# Patient Record
Sex: Female | Born: 1935 | Race: White | Hispanic: No | Smoking: Never smoker
Health system: Southern US, Community
[De-identification: ages and names within clinical notes are randomized; demographics above are authoritative.]

## PROBLEM LIST (undated history)

## (undated) DIAGNOSIS — R32 Unspecified urinary incontinence: Secondary | ICD-10-CM

## (undated) DIAGNOSIS — M1990 Unspecified osteoarthritis, unspecified site: Secondary | ICD-10-CM

## (undated) DIAGNOSIS — M810 Age-related osteoporosis without current pathological fracture: Secondary | ICD-10-CM

## (undated) DIAGNOSIS — H409 Unspecified glaucoma: Secondary | ICD-10-CM

## (undated) DIAGNOSIS — M778 Other enthesopathies, not elsewhere classified: Secondary | ICD-10-CM

## (undated) DIAGNOSIS — F32A Depression, unspecified: Secondary | ICD-10-CM

## (undated) DIAGNOSIS — R600 Localized edema: Secondary | ICD-10-CM

## (undated) DIAGNOSIS — R29898 Other symptoms and signs involving the musculoskeletal system: Secondary | ICD-10-CM

## (undated) DIAGNOSIS — G5603 Carpal tunnel syndrome, bilateral upper limbs: Secondary | ICD-10-CM

## (undated) DIAGNOSIS — M7582 Other shoulder lesions, left shoulder: Secondary | ICD-10-CM

## (undated) DIAGNOSIS — S92901A Unspecified fracture of right foot, initial encounter for closed fracture: Secondary | ICD-10-CM

## (undated) DIAGNOSIS — I639 Cerebral infarction, unspecified: Secondary | ICD-10-CM

## (undated) DIAGNOSIS — F329 Major depressive disorder, single episode, unspecified: Secondary | ICD-10-CM

## (undated) DIAGNOSIS — H5462 Unqualified visual loss, left eye, normal vision right eye: Secondary | ICD-10-CM

## (undated) HISTORY — DX: Unspecified glaucoma: H40.9

## (undated) HISTORY — DX: Age-related osteoporosis without current pathological fracture: M81.0

## (undated) HISTORY — DX: Carpal tunnel syndrome, bilateral upper limbs: G56.03

## (undated) HISTORY — DX: Major depressive disorder, single episode, unspecified: F32.9

## (undated) HISTORY — DX: Unspecified fracture of right foot, initial encounter for closed fracture: S92.901A

## (undated) HISTORY — DX: Unspecified osteoarthritis, unspecified site: M19.90

## (undated) HISTORY — DX: Depression, unspecified: F32.A

## (undated) HISTORY — DX: Cerebral infarction, unspecified: I63.9

## (undated) HISTORY — DX: Other symptoms and signs involving the musculoskeletal system: R29.898

## (undated) HISTORY — DX: Other enthesopathies, not elsewhere classified: M77.8

## (undated) HISTORY — DX: Unqualified visual loss, left eye, normal vision right eye: H54.62

## (undated) HISTORY — DX: Localized edema: R60.0

## (undated) HISTORY — DX: Unspecified urinary incontinence: R32

## (undated) HISTORY — PX: CHOLECYSTECTOMY: SHX55

## (undated) HISTORY — DX: Other shoulder lesions, left shoulder: M75.82

---

## 1998-10-10 HISTORY — PX: CATARACT EXTRACTION, BILATERAL: SHX1313

## 2001-10-10 HISTORY — PX: CARPAL TUNNEL RELEASE: SHX101

## 2004-09-16 ENCOUNTER — Ambulatory Visit: Payer: Self-pay | Admitting: Internal Medicine

## 2005-04-05 ENCOUNTER — Ambulatory Visit: Payer: Self-pay | Admitting: Ophthalmology

## 2005-06-29 ENCOUNTER — Ambulatory Visit: Payer: Self-pay | Admitting: Unknown Physician Specialty

## 2005-07-06 ENCOUNTER — Ambulatory Visit: Payer: Self-pay | Admitting: Unknown Physician Specialty

## 2005-07-10 HISTORY — PX: JOINT REPLACEMENT: SHX530

## 2005-08-01 ENCOUNTER — Other Ambulatory Visit: Payer: Self-pay

## 2005-08-09 ENCOUNTER — Inpatient Hospital Stay: Payer: Self-pay | Admitting: Unknown Physician Specialty

## 2005-08-11 ENCOUNTER — Encounter: Payer: Self-pay | Admitting: Internal Medicine

## 2005-08-31 ENCOUNTER — Ambulatory Visit: Payer: Self-pay | Admitting: Unknown Physician Specialty

## 2006-05-31 ENCOUNTER — Ambulatory Visit: Payer: Self-pay | Admitting: Internal Medicine

## 2007-09-21 ENCOUNTER — Ambulatory Visit: Payer: Self-pay | Admitting: Specialist

## 2008-11-19 ENCOUNTER — Ambulatory Visit: Payer: Self-pay | Admitting: Otolaryngology

## 2010-03-10 ENCOUNTER — Ambulatory Visit: Payer: Self-pay | Admitting: Oncology

## 2010-03-24 ENCOUNTER — Ambulatory Visit: Payer: Self-pay | Admitting: Unknown Physician Specialty

## 2010-03-26 ENCOUNTER — Ambulatory Visit: Payer: Self-pay | Admitting: Unknown Physician Specialty

## 2010-04-02 ENCOUNTER — Ambulatory Visit: Payer: Self-pay | Admitting: Oncology

## 2010-04-07 ENCOUNTER — Ambulatory Visit: Payer: Self-pay | Admitting: Oncology

## 2010-04-09 ENCOUNTER — Ambulatory Visit: Payer: Self-pay | Admitting: Oncology

## 2010-08-04 ENCOUNTER — Emergency Department (HOSPITAL_COMMUNITY): Admission: EM | Admit: 2010-08-04 | Discharge: 2010-08-04 | Payer: Self-pay | Admitting: Emergency Medicine

## 2011-02-13 ENCOUNTER — Emergency Department (HOSPITAL_COMMUNITY)
Admission: EM | Admit: 2011-02-13 | Discharge: 2011-02-13 | Disposition: A | Discharge status: Home / Self Care | Payer: Medicare Other | Attending: Emergency Medicine | Admitting: Emergency Medicine

## 2011-02-13 DIAGNOSIS — Z8673 Personal history of transient ischemic attack (TIA), and cerebral infarction without residual deficits: Secondary | ICD-10-CM | POA: Insufficient documentation

## 2011-02-13 DIAGNOSIS — S30860A Insect bite (nonvenomous) of lower back and pelvis, initial encounter: Secondary | ICD-10-CM | POA: Insufficient documentation

## 2011-02-13 DIAGNOSIS — H409 Unspecified glaucoma: Secondary | ICD-10-CM | POA: Insufficient documentation

## 2011-02-13 DIAGNOSIS — W57XXXA Bitten or stung by nonvenomous insect and other nonvenomous arthropods, initial encounter: Secondary | ICD-10-CM | POA: Insufficient documentation

## 2011-07-05 ENCOUNTER — Ambulatory Visit: Payer: Self-pay | Admitting: Otolaryngology

## 2013-09-25 ENCOUNTER — Inpatient Hospital Stay: Payer: Self-pay | Admitting: Internal Medicine

## 2013-09-25 LAB — TSH: Thyroid Stimulating Horm: 0.975 u[IU]/mL

## 2013-09-25 LAB — URINALYSIS, COMPLETE
Bacteria: NONE SEEN
Bilirubin,UR: NEGATIVE
Blood: NEGATIVE
Glucose,UR: NEGATIVE mg/dL (ref 0–75)
Ketone: NEGATIVE
Nitrite: NEGATIVE
Protein: NEGATIVE
Specific Gravity: 1.008 (ref 1.003–1.030)
Squamous Epithelial: 1

## 2013-09-25 LAB — TROPONIN I: Troponin-I: 0.05 ng/mL

## 2013-09-26 ENCOUNTER — Ambulatory Visit: Payer: Self-pay | Admitting: Internal Medicine

## 2013-09-26 LAB — CBC WITH DIFFERENTIAL/PLATELET
Eosinophil #: 0.1 10*3/uL (ref 0.0–0.7)
Eosinophil %: 1.2 %
HCT: 38 % (ref 35.0–47.0)
Lymphocyte #: 2.5 10*3/uL (ref 1.0–3.6)
Lymphocyte %: 28.7 %
MCH: 29.6 pg (ref 26.0–34.0)
Monocyte %: 8.9 %
RDW: 12.1 % (ref 11.5–14.5)

## 2013-09-26 LAB — TROPONIN I: Troponin-I: 0.07 ng/mL — ABNORMAL HIGH

## 2013-09-26 LAB — BASIC METABOLIC PANEL
Anion Gap: 5 — ABNORMAL LOW (ref 7–16)
BUN: 15 mg/dL (ref 7–18)
Co2: 29 mmol/L (ref 21–32)

## 2013-09-27 LAB — TROPONIN I: Troponin-I: 0.07 ng/mL — ABNORMAL HIGH

## 2013-09-28 LAB — CBC WITH DIFFERENTIAL/PLATELET
Basophil #: 0.1 10*3/uL (ref 0.0–0.1)
Basophil %: 0.4 %
Eosinophil #: 0 10*3/uL (ref 0.0–0.7)
HCT: 39.3 % (ref 35.0–47.0)
HGB: 12.7 g/dL (ref 12.0–16.0)
Lymphocyte %: 16.3 %
MCHC: 32.3 g/dL (ref 32.0–36.0)
Neutrophil %: 74.5 %

## 2013-09-28 LAB — BASIC METABOLIC PANEL
Co2: 27 mmol/L (ref 21–32)
EGFR (African American): >60
EGFR (Non-African Amer.): >60
Glucose: 139 mg/dL — ABNORMAL HIGH (ref 65–99)
Osmolality: 275 (ref 275–301)
Potassium: 3.7 mmol/L (ref 3.5–5.1)
Sodium: 136 mmol/L (ref 136–145)

## 2013-09-29 LAB — CBC WITH DIFFERENTIAL/PLATELET
Basophil #: 0 10*3/uL (ref 0.0–0.1)
Basophil %: 0.3 %
Eosinophil %: 0.1 %
HCT: 39.4 % (ref 35.0–47.0)
HGB: 12.9 g/dL (ref 12.0–16.0)
Monocyte #: 1.3 x10 3/mm — ABNORMAL HIGH (ref 0.2–0.9)
RBC: 4.4 10*6/uL (ref 3.80–5.20)

## 2013-09-29 LAB — BASIC METABOLIC PANEL
Anion Gap: 8 (ref 7–16)
Calcium, Total: 8.8 mg/dL (ref 8.5–10.1)
Co2: 27 mmol/L (ref 21–32)
EGFR (African American): >60
EGFR (Non-African Amer.): >60
Glucose: 134 mg/dL — ABNORMAL HIGH (ref 65–99)
Osmolality: 283 (ref 275–301)
Potassium: 3.7 mmol/L (ref 3.5–5.1)
Sodium: 139 mmol/L (ref 136–145)

## 2013-09-29 LAB — URINALYSIS, COMPLETE
Glucose,UR: NEGATIVE mg/dL (ref 0–75)
Nitrite: NEGATIVE
Specific Gravity: 1.023 (ref 1.003–1.030)
WBC UR: 26 /HPF (ref 0–5)

## 2013-09-30 ENCOUNTER — Encounter: Payer: Self-pay | Admitting: Internal Medicine

## 2013-09-30 LAB — BASIC METABOLIC PANEL
Anion Gap: 4 — ABNORMAL LOW (ref 7–16)
BUN: 21 mg/dL — ABNORMAL HIGH (ref 7–18)
Calcium, Total: 8.6 mg/dL (ref 8.5–10.1)
Chloride: 104 mmol/L (ref 98–107)
EGFR (Non-African Amer.): >60
Osmolality: 278 (ref 275–301)
Potassium: 3.5 mmol/L (ref 3.5–5.1)
Sodium: 137 mmol/L (ref 136–145)

## 2013-09-30 LAB — CBC WITH DIFFERENTIAL/PLATELET
Basophil #: 0 10*3/uL (ref 0.0–0.1)
Eosinophil %: 0.3 %
Lymphocyte #: 2.5 10*3/uL (ref 1.0–3.6)
Lymphocyte %: 21.7 %
MCHC: 31.8 g/dL — ABNORMAL LOW (ref 32.0–36.0)
Monocyte %: 10.2 %
Neutrophil #: 7.8 10*3/uL — ABNORMAL HIGH (ref 1.4–6.5)
RDW: 12.3 % (ref 11.5–14.5)

## 2013-10-01 ENCOUNTER — Ambulatory Visit: Payer: Self-pay | Admitting: Gerontology

## 2013-10-10 ENCOUNTER — Encounter: Payer: Self-pay | Admitting: Internal Medicine

## 2013-10-10 ENCOUNTER — Ambulatory Visit: Payer: Self-pay | Admitting: Internal Medicine

## 2013-10-18 LAB — RAPID INFLUENZA A&B ANTIGENS

## 2015-01-30 NOTE — H&P (Signed)
PATIENT NAME:  Kristin Graham, Kristin Graham MR#:  [id number] DATE OF BIRTH:  22-Jan-1916  DATE OF ADMISSION:  09/25/2013  CHIEF COMPLAINT: Back pain, speech difficulties.   HISTORY OF PRESENT ILLNESS: The patient is a 81 year old female with history of osteoarthritis and degenerative disk disease, history of left eye vision loss, who presents from home after daughter called in about worsening incontinence. Story is slightly challenging to follow but it appears that she had a fall about a week ago with some back discomfort but was able to walk after this, did not show any signs or sequelae severe injury. She had episodes then a few days later where she seemed to be having trouble speaking although she was not confused. They report that she has not been walking as well, seeming to not move the left leg as well, although it is difficult to tell how long this has gone on. Her speech had improved; however, she had another fall about a day or 2 ago and after this time speech became worse. She developed worsening back pain approximately 24 hours after the fall. She has been completely incontinent of urine although continent of bowel, though has a long history of bladder incontinence with previous urology evaluation. She reports that the left eye vision may be worse as well. On evaluation, she is having spasms of pain in her left mid back. She is having trouble lifting her left leg though does not appear to have point tenderness over the hip. She has dysarthria which is new and appears to have some left facial droop. She has been on aspirin 325 already. She is admitted now with what appears to be new stroke symptoms, recurrent falls with back spasm related to this, worsening incontinence and possibly worsening left eye vision loss.   PAST MEDICAL HISTORY: 1.  Depression and anxiety.  2.  History of glaucoma.  3.  Osteoarthritis.  4.  History of prior CVA.  5.  Chronic urinary incontinence with prior urology evaluation with  failure of pessary and multiple medications.  6.  Osteoporosis with history of right foot fracture.  7.  Left eye vision loss with prior evaluation through Dr. Fransico Michael thought to be secondary to glaucoma versus ischemic neuropathy.  8.  Chronic leg edema consistent with venous insufficiency.  9.  Bilateral carpal tunnel syndrome  with prior surgery.  10.  Degenerative disk disease. 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 She was discussed at tumor conference and seen by neurosurgery, who agreed.  11.  Status post bilateral cataract repairs.  12.  Status post cholecystectomy.  13.  History of left knee replacement.   ALLERGIES: ANTI-INFLAMMATORIES CAUSE EDEMA. VICODIN CAUSES NAUSEA AND VOMITING.   MEDICATIONS: 1.  Aspirin 325 mg p.o. daily.  2.  Vitamin B50 one tablet p.o. daily.  3.  MSM 500 mg p.o. b.i.d.  4.  Multivitamin 1 p.o. daily.  5.  Vitamin C 1000 mg p.o. t.i.d.  6.  Vitamin E 400 units daily.  7.   Zinc 40 mg p.o. daily.  8.  Vitamin B12 100 mcg p.o. daily.  9.  Tylenol as needed.  10.  Cosopt 1 drop into both eyes twice a day.  11.  Calcium 600 mg p.o. b.i.d.  12.  Omega-3 750 mg p.o. daily.  13.  Potassium 99 one p.o. daily.  14.  Lasix 20  mg p.o. daily.   SOCIAL HISTORY: No tobacco or alcohol.   FAMILY HISTORY: Cancer, stroke, osteoporosis.   REVIEW OF SYSTEMS: Please see HPI.  No recent fevers, chills. No bowel incontinence. No headache currently. Remainder of complete review of systems is negative.   PHYSICAL EXAMINATION: VITAL SIGNS: Blood pressure 130/71, temperature 98.8, pulse 73.  GENERAL: Elderly female, wincing periodically from discomfort in the back.  EYES: Postoperative changes bilaterally with decreased visual acuity in the left eye.  EARS, NOSE AND THROAT: Left side of the mouth is held open with mild  dysarthria which is new.  NECK: Trachea midline. No thyromegaly.  HEART: Regular rate and rhythm without gallops or rubs. Carotid and radial pulses 2+.  LUNGS: Clear bilaterally without wheeze or retraction. Saturation 94% on room air.  ABDOMEN: Soft, nontender, positive bowel sounds.  LYMPH NODES: No cervical or supraclavicular nodes.  MUSCULOSKELETAL: No clubbing or cyanosis. Trace ankle edema.  NEUROLOGIC: Decreased visual acuity in the left eye. Left facial droop as noted above. Upper extremity strength appears to be symmetrical though a little bit limited exam secondary to back pain. Ambulates with keeping the left knee and hip flexed, then taking weight very rapidly off the left leg when trying to walk.   IMPRESSION AND PLAN: 1.  Left facial droop. Worrisome for stroke symptoms, particularly with stuttering nature. We will get stat CT to exclude bleed. Consider adding Plavix but need to see what the CT scan looks like first. Baby aspirin for now. Check echo, carotid Dopplers and will get neurology to see the patient.  2.  Back pain. We will get a chest x-ray to take a look at this and should be able to see the back with this as well. Will get L-spine films as well given the urinary incontinence, though it is unclear whether this is really a change or whether she is hurting enough that she is simply not getting to go urinate. Would ask neurology help with this also.  3.  Glaucoma. Would continue her on her home eyedrops. Consider ophthalmology consultation, would like to see what neurology has to say with the above.   4.  Deep vein thrombosis prophylaxis. Will use thromboembolic disease hose for now until bleed is excluded.    ____________________________ Lynnea FerrierBert J. Klein III, MD bjk:cs D: 09/25/2013 17:43:13 ET T: 09/25/2013 18:20:38 ET JOB#: [id number]  cc: Lynnea FerrierBert J. Klein III, MD, <Dictator> Daniel NonesBERT KLEIN MD ELECTRONICALLY SIGNED 10/01/2013 12:25

## 2015-01-30 NOTE — Discharge Summary (Signed)
PATIENT NAME:  Kristin Graham, Kristin Graham MR#:  [id number] DATE OF BIRTH:  04-24-1916  DATE OF ADMISSION:  09/25/2013 DATE OF DISCHARGE:  09/30/2013  FINAL DIAGNOSES:  1.  Left pontine cerebrovascular accident, acute.  2.  Fall with pulmonary contusion and back spasms.  3.  Chronic left eye vision loss.  4.  Glaucoma.  5.  Osteoarthritis with severe degenerative disk disease.  6.  Left leg numbness, weakness and neuropathy secondary to degenerative disk disease.  7.  Chronic leg edema with venous insufficiency and mild lymphedema.  8.  History of carpal tunnel syndrome with prior surgery.  9.  History of bilateral cataract repairs.  10. History of cholecystectomy.  11. History of left knee replacement.  12. Depression and anxiety.  13. Osteoporosis.  14. Chronic urinary incontinence.   HISTORY AND PHYSICAL: Please see dictated admission history and physical.    HOSPITAL COURSE: The patient was admitted after having a fall and suffering significant left back pain. Films were performed of the chest, lower spine and left hip, with significant arthritis, but no fracture evident. Small left pleural effusion was seen and pulmonary saw her. It was thought most likely to represent a small hemothorax secondary to pulmonary contusion from a fall and did not change on subsequent followup films. Her oxygen saturation remained stable. She underwent CT, which showed no acute changes. Subsequent MRI revealed a left pontine cerebrovascular accident, which appeared to be the source of her dysarthria. Physical therapy and speech therapy saw the patient. She passed her swallowing screen and her speech did improve. Carotid Dopplers were negative. Echocardiogram revealed preserved LV function and no source of embolism. Neurology saw the patient and they recommended anticoagulation with aspirin only given her advanced age and multiple falls.   Physical therapy continued work with the patient and she showed significant  difficulty with this. She normally ambulates flexed at the hip secondary to significant degenerative disk disease; however, this was clearly worse. She also was having some back spasms, which interfered with mobility. It became clear that she would require more help than she would be able to obtain at home, although her pain did seem to improve.   Ophthalmology was kind enough to see the patient in the hospital as well with concerns about left vision changes. Her pressures were stable. Dilated did not appear to reveal any acute changes. The family members are aware that the exam is somewhat limited by the inability to use a slit lamp and more sophisticated equipment here in the hospital as these are not available outside the ophthalmologist's office. The plan is for her to return her ophthalmologist's office in the near future and family members are comfortable with this. We discussed placement secondary to poor mobility. Palliative care saw the patient as well. The patient and family members are willing to pursue placement, and so the patient will be discharged to skilled nursing facility in stable condition with physical activity up with a walker with assistance as tolerated. Her diet will be no added salt, mechanical soft, with the patient to be monitored for signs or symptoms of aspiration. PT, speech therapy and OT to see the patient. She will follow up with the nursing home physician. We will also plan to have her follow up with Dr. Fransico Michael in the near future to evaluate her eye.   DISCHARGE MEDICATIONS:  1.  Atorvastatin 10 mg p.o. at bedtime, new medication. 2.  Aspirin 325 mg p.o. daily.  3.  Skelaxin 800 mg half  tablet p.o. t.i.d. as needed for back pain.  4.  Colace 100 mg p.o. b.i.d., hold for loose stools.  5.  Furosemide 20 mg p.o. daily as needed for edema.  6.  Lumigan 1 drop to the left eye once a day.  7.  Cosopt 1 drop to each eye twice a day.  8.  Calcium plus D 600 mg p.o. daily.   9.  Vitamin B12 1000 mcg p.o. daily.  10. Omega-3 600 mg p.o. daily.  11. Ultracet 1 p.o. 4 times a day as needed for pain.  12. Acetaminophen 325 mg p.o. t.i.d. while awake, to be given in a standing dose for pain relief.   CODE STATUS: The patient was full code during his hospitalization.  ____________________________ Lynnea FerrierBert J. Klein III, MD bjk:aw D: 09/30/2013 13:13:16 ET T: 09/30/2013 13:25:40 ET JOB#: [id number]  cc: Lynnea FerrierBert J. Klein III, MD, <Dictator> Daniel NonesBERT KLEIN MD ELECTRONICALLY SIGNED 10/01/2013 12:25

## 2015-01-30 NOTE — Consult Note (Signed)
Referring Physician:  Tama High   Primary Care Physician:  [facility]ama [facility], [address] [place], [place] [address], [phone]  Reason for Consult: Admit Date: 25-Sep-2013  Chief Complaint: Back pain, speech difficulties  Reason for Consult: CVA; back pain, speech difficulties   History of Present Illness: History of Present Illness:   81 year old woman with a complex medical history presents with decreased ambulation, decreased use of left leg and slurred speech.  Seems that symptoms started a few days ago and have waxed and waned.  Also some new left eye visual disturbance.  Initially seen to have mild facial droop.  At this point, most of these symptoms have resolved.  Still some very mild LLE weakness, no definite slurred speech.  She was worked up and found on MRI to have an acute to subacute left pontine infarction.  Patient feels her symptoms are improving over the past 24 hours.  Feels in particular her speech is much better.  Symptoms are constant.  Mild severity.  No clear provoking factors.   MEDICAL AND SURGICAL HISTORIES: Depression and anxiety.  History of glaucoma.  Osteoarthritis.  History of prior CVA.  Chronic urinary incontinence with prior urology evaluation with failure of pessary and multiple medications.  Osteoporosis with history of right foot fracture.  Left eye vision loss with prior evaluation through Dr. Tobe Sos thought to be secondary to glaucoma versus ischemic neuropathy.  Chronic leg edema consistent with venous insufficiency.  Bilateral carpal tunnel syndrome  with prior surgery.  Degenerative disk disease. 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 She was discussed at tumor conference and seen by neurosurgery, who agreed.  Status post bilateral cataract repairs.  Status post cholecystectomy.   History of left knee replacement.  HISTORY: tobacco or alcohol use. HISTORY:   Aspirin 325 mg p.o. daily.  Vitamin B50 one tablet p.o. daily.  MSM 500 mg p.o. b.i.d.  Multivitamin 1 p.o. daily.  Vitamin C 1000 mg p.o. t.i.d.  Vitamin E 400 units daily.   Zinc 40 mg p.o. daily.  Vitamin B12 100 mcg p.o. daily.  Tylenol as needed.  Cosopt 1 drop into both eyes twice a day.  Calcium 600 mg p.o. b.i.d.  Omega-3 750 mg p.o. daily.  Potassium 99 one p.o. daily.  Lasix 20 mg p.o. daily.  CAUSE EDEMA. CAUSES NAUSEA AND VOMITING.   ROS:  General denies complaints   HEENT no complaints   Lungs no complaints   Cardiac no complaints   GI no complaints   GU no complaints   Musculoskeletal no complaints   Extremities no complaints   Skin no complaints   Endocrine no complaints   Psych no complaints   Past Medical/Surgical Hx:  stroke:   shingles:   osteoporosis:   gout:   glaucoma:   depression:   chicken pox:   arthritis:   removal of distal locking screws:   gallbladder removed:   carpal tunnel release:   left total knee replacement:   Home Medications: Medication Instructions Last Modified Date/Time  Cosopt ophthalmic solution both eyes 17-Dec-14 19:45  tylenol 325-659m every 3-4 hours as needed  17-Dec-14 19:45  tramadol37.5/acetaminophen 1-2 po every 4-6 hours PRN  17-Dec-14 19:45  Calcium 600+D   2 times a day  17-Dec-14 19:45  vitamin c 1000 mg  2 times a day  17-Dec-14 19:45  vitamin e 400   once a day  17-Dec-14 19:45  vitamin b 50    once a day  17-Dec-14 19:45  b12 1000   once a day  17-Dec-14 19:45  multi  vitamin senior    once a day  17-Dec-14 19:45  Zinc 50 mg   once a day  17-Dec-14 19:45  msg 1000   2 times a day  17-Dec-14 19:45  genko 60 mg    once a day  17-Dec-14 19:45  magnesium 250   once a day (at bedtime)  17-Dec-14 19:45  asa 325 mg    once a day  17-Dec-14 19:45  Lasix 0.5  of a 10 mg tablet orally  17-Dec-14 19:45  omega 3 600 mg    2  times a day 17-Dec-14 19:45   [facility] Current Meds:   Sodium Chloride 0.9%, 1000 ml at 50 ml/hr  Acetaminophen * tablet, ( Tylenol (325 mg) tablet)  650 mg Oral q4h PRN for pain or temp. greater than 100.4  - Indication: Pain/Fever  Aspirin Enteric Coated tablet, ( Ecotrin)  81 mg Oral daily  - Indication: Pain/Fever/Thromboembolic Disorders/Post MI/Prophylaxis MI  Instructions:  Initiate Bleeding Precautions Protocol--DO NOT CRUSH  Docusate Sodium capsule, ( Colace)  100 mg Oral bid PRN for constipation  - Indication: Stool Softener  Instructions:  hold for loose stool  Dorzolamide-Timolol Ophth drops,  ( Cosopt Ophth drops )  1 drop(s) Both Eyes bid  -Indication:Glaucoma  Ondansetron injection, ( Zofran injection )  4 mg, IV push, q4h PRN for Nausea/Vomiting  Indication: Nausea/ Vomiting  Pantoprazole tablet, 40 mg Oral daily  - Indication: Erosive Esophagitis/ GERD  Instructions:  DO NOT CRUSH  Sodium Chloride 0.9% injection, 3 ml, IV push, q6h  Nursing Saline Flush, 3 to 6 ml, IV push, Q1M PRN for IV Maintenance  Clopidogrel tablet,  ( pLAVix)  75 mg Oral daily  Instructions:  Initiate Bleeding Precautions Protocol  Metaxalone tablet,  ( Skelaxin)  400 mg Oral tid PRN for back pain  Bimatoprost 0.01% Ophth soln,  ( Lumigan 0.01% Ophth soln )  1 drop(s) Left Eye at bedtime  -Indication:Ocular Hypertension, Glaucoma  Allergies:  Vicodin: N/V  Bee Stings: Itching  Anti-inflammatories: Unknown  Vital Signs: **Vital Signs.:   18-Dec-14 08:47  Vital Signs Type Routine  Temperature Temperature (F) 98.4  Celsius 36.8  Temperature Source oral  Pulse Pulse 67  Respirations Respirations 20  Systolic BP Systolic BP 740  Diastolic BP (mmHg) Diastolic BP (mmHg) 74  Mean BP 93  Pulse Ox % Pulse Ox % 93  Pulse Ox Activity Level  At rest  Oxygen Delivery Room Air/ 21 %   EXAM: GENERAL: Pleasant elderly woman.  In NAD.  Normocephalic and atraumatic.  EYES: Funduscopic  exam shows normal disc size, appearance and C/D ratio without clear evidence of papilledema.  CARDIOVASCULAR: S1 and S2 sounds are within normal limits, without murmurs, gallops, or rubs.  MUSCULOSKELETAL: Bulk - Normal Tone - Normal Pronator Drift - Absent bilaterally. Ambulation - Deferred due to falls risk.   R/L 4/4    Shoulder abduction (deltoid/supraspinatus, axillary/suprascapular n, C5) 4/4    Elbow flexion (biceps brachii, musculoskeletal n, C5-6) 4/4    Elbow extension (triceps, radial n, C7) 4/4    Finger adduction (interossei, ulnar n, T1)   4/4-    Hip flexion (iliopsoas, L1/L2) 4/4    Knee flexion (hamstrings, sciatic n, L5/S1)  4/4    Knee extension (quadriceps, femoral n, L3/4) 4/4    Ankle dorsiflexion (tibialis anterior, deep fibular n, L4/5) 4/4    Ankle plantarflexion (gastroc, tibial n, S1)   NEUROLOGICAL: MENTAL STATUS: Patient is oriented to person, place and time.  Recent and remote memory are intact.  Attention span and concentration are intact.  Naming, repetition, comprehension and expressive speech are within normal limits.  Patient's fund of knowledge is within normal limits for educational level.  CRANIAL NERVES: Normal    CN II (normal visual acuity and visual fields) Normal    CN III, IV, VI (extraocular muscles are intact) Normal    CN V (facial sensation is intact bilaterally) Normal    CN VII (facial strength is intact bilaterally) Normal    CN VIII (hearing is intact bilaterally) Normal    CN IX/X (palate elevates midline, normal phonation) Normal    CN XI (shoulder shrug strength is normal and symmetric) Normal    CN XII (tongue protrudes midline)   SENSATION: Intact to pain and temp bilaterally (spinothalamic tracts) Intact to position and vibration bilaterally (dorsal columns)   REFLEXES: R/L 2+/2+    Biceps 2+/2+    Brachioradialis   2+/2+    Patellar 2+/2+    Achilles   COORDINATION/CEREBELLAR: Finger to nose testing is within  normal limits..  Lab Results:  Thyroid:  17-Dec-14 19:31   Thyroid Stimulating Hormone 0.975 (0.45-4.50 (International Unit)  ----------------------- Pregnant patients have  different reference  ranges for TSH:  - - - - - - - - - -  Pregnant, first trimetser:  0.36 - 2.50 uIU/mL)  LabObservation:  18-Dec-14 09:35   OBSERVATION Reason for Test  Routine Micro:  17-Dec-14 18:57   Micro Text Report URINE CULTURE   COMMENT                   NO GROWTH IN 8-12 HOURS   ANTIBIOTIC                       Specimen Source CLEAN CATCH  Culture Comment NO GROWTH IN 8-12 HOURS  Result(s) reported on 26 Sep 2013 at 12:56PM.  General Ref:  17-Dec-14 19:31   Vitamin B12, Serum ========== TEST NAME ==========  ========= RESULTS =========  = REFERENCE RANGE =  VITAMIN B12  Vitamin B12 Vitamin B12                     [facility]  >1999 pg/mL          ]           [id number]               [facility]            No: [id number]           [address], [place], [place] [address]           Lindon Romp, MD         [phone]   Result(s) reported on 26 Sep 2013 at 06:47AM.  Cardiology:  18-Dec-14 06:35   Ventricular Rate 72  Atrial Rate 72  P-R Interval 230  QRS Duration 84  QT 408  QTc 446  P Axis 33  R Axis -48  T Axis 18  ECG interpretation Sinus rhythm with 1st degree A-V block Left anterior fascicular block Minimal voltage criteria for LVH, may be normal variant Inferior infarct , age undetermined Anteroseptal  infarct , age undetermined Abnormal ECG When compared with ECG of 01-Aug-2005 14:16, PR interval has increased Anteroseptal infarct is now Present Inferior infarct is now Present ----------unconfirmed---------- Confirmed by OVERREAD, NOT ([id number]), editor PEARSON, BARBARA ([id number]) on 09/26/2013 10:20:51 AM    09:12   Echo Doppler REASON FOR EXAM:     COMMENTS:     PROCEDURE: [place] - ECHO DOPPLER COMPLETE(TRANSTHOR)  - Sep 26 2013  9:12AM   RESULT: Echocardiogram  Report  Patient Name:   ALITZEL COOKSON Date of Exam: 09/26/2013 Medical Rec #:  [id number]              Custom1: Date of Birth:  1916/02/04           Height:       68.1 in Patient Age:    89 years            Weight:       174.2 lb Patient Gender: F                   BSA:          1.93 m??  Indications: CVA Sonographer:    Sherrie Sport RDCS Referring Phys: [facility]  Summary:  1. Left ventricular ejection fraction, by visual estimation, is 70 to  75%.  2. Normal global left ventricular systolic function.  3. Moderately dilated left atrium.  4. Moderately dilated right atrium.  5. Moderate mitral valve regurgitation.  6. Moderate tricuspid regurgitation. 2D AND M-MODE MEASUREMENTS (normal ranges within parentheses): Left Ventricle:          Normal IVSd (2D):      1.17 cm (0.7-1.1) LVPWd (2D):     1.03 cm (0.7-1.1) Aorta/LA:       Normal LVIDd (2D):     4.01 cm (3.4-5.7) Aortic Root (2D): 3.40 cm (2.4-3.7) LVIDs (2D):     2.19 cm           Left Atrium (2D): 3.20 cm (1.9-4.0) LV FS (2D):     45.4 %   (>25%) LV EF (2D):     77.3 %   (>50%)       Right Ventricle:                                   RVd (2D):        1.01 cm LV DIASTOLIC FUNCTION: MV Peak E: 0.73 m/s E/e' Ratio: 19.60 MV Peak A: 1.04 m/s Decel Time: 268 msec E/A Ratio: 0.70 SPECTRAL DOPPLER ANALYSIS (where applicable): Mitral Valve: MV P1/2 Time: 77.72 msec MV Area, PHT: 2.83 cm?? Aortic Valve: AoV Max Vel: 1.12 m/s AoV Peak PG: 5.0 mmHg AoV Mean PG: LVOT Vmax: 1.13 m/s LVOT VTI:  LVOT Diameter: 2.20 cm AoV Area, Vmax: 3.83 cm?? AoV Area, VTI:  AoV Area, Vmn: Tricuspid Valve and PA/RV Systolic Pressure: TR Max Velocity: 2.79 m/s RA  Pressure: 5 mmHg RVSP/PASP: 36.1 mmHg Pulmonic Valve: PV Max Velocity: 0.74 m/s PV Max PG: 2.2 mmHg PV Mean PG:  PHYSICIAN INTERPRETATION: Left Ventricle: The left ventricular internal cavity size was normal. LV  posterior wall thickness was normal. Global LV systolic function was   normal. Left ventricular ejection fraction, by visual estimation, is 70  to 75%. Right Ventricle: Normal right ventricular size, wallthickness, and  systolic function. RV wall thickness is normal. Left Atrium: The left atrium is moderately dilated. Right Atrium: The right atrium  is moderately dilated. Pericardium: There is no evidence of pericardial effusion. Mitral Valve: Moderate mitral valve regurgitation is seen. Tricuspid Valve: Moderate tricuspid regurgitation is visualized. The  tricuspid regurgitant velocity is 2.79 m/s, and with an assumed right  atrial pressure of 5 mmHg, the estimated right ventricular systolic  pressure is normal at 36.1 mmHg. Aortic Valve: The aortic valve is trileaflet and structurally normal,  with normal leaflet excursion; without any evidence of aortic stenosis or  insufficiency. Pulmonic Valve: Structurally normal pulmonic valve, with normal leaflet  excursion. Aorta: The aortic root and ascending aorta are structurally normal, with  no evidence of dilitation.  [id number] Serafina Royals MD Electronically signed by [id number] Serafina Royals MD Signature Date/Time: 09/26/2013/1:10:57 PM  *** Final ***  IMPRESSION: .    Verified By: Corey Skains  ([facility]), M.D., MD  Routine Chem:  18-Dec-14 04:14   Glucose, Serum  109  BUN 15  Creatinine (comp)  0.53  Sodium, Serum 141  Potassium, Serum 3.6  Chloride, Serum 107  CO2, Serum 29  Calcium (Total), Serum 8.9  Anion Gap  5  Osmolality (calc) 283  eGFR (African American) >60  eGFR (Non-African American) >60 (eGFR values <100m/min/1.73 m2 may be an indication of chronic kidney disease (CKD). Calculated eGFR is useful in patients with stable renal function. The eGFR calculation will not be reliable in acutely ill patients when serum creatinine is changing rapidly. It is not useful in  patients on dialysis. The eGFR calculation may not be applicable to patients at the low and high extremes of body  sizes, pregnant women, and vegetarians.)    20:00   Result Comment TROPONIN - RESULTS VERIFIED BY REPEAT TESTING.  - LEANNA HENDERSON NOTIFIED @2116   - 09/26/13 BY [facility]  - READ-BACK PROCESS PERFORMED.  Result(s) reported on 26 Sep 2013 at 09:18PM.  Cardiac:  17-Dec-14 19:31   Troponin I 0.05 (0.00-0.05 0.05 ng/mL or less: NEGATIVE  Repeat testing in 3-6 hrs  if clinically indicated. >0.05 ng/mL: POTENTIAL  MYOCARDIAL INJURY. Repeat  testing in 3-6 hrs if  clinically indicated. NOTE: An increase or decrease  of 30% or more on serial  testing suggests a  clinically important change)  18-Dec-14 20:00   Troponin I  0.07 (0.00-0.05 0.05 ng/mL or less: NEGATIVE  Repeat testing in 3-6 hrs  if clinically indicated. >0.05 ng/mL: POTENTIAL  MYOCARDIAL INJURY. Repeat  testing in 3-6 hrs if  clinically indicated. NOTE: An increase or decrease  of 30% or more on serial  testing suggests a  clinically important change)  Routine UA:  17-Dec-14 18:57   Color (UA) Yellow  Clarity (UA) Clear  Glucose (UA) Negative  Bilirubin (UA) Negative  Ketones (UA) Negative  Specific Gravity (UA) 1.008  Blood (UA) Negative  pH (UA) 7.0  Protein (UA) Negative  Nitrite (UA) Negative  Leukocyte Esterase (UA) Negative (Result(s) reported on 25 Sep 2013 at 07:29PM.)  RBC (UA) NONE SEEN  WBC (UA) NONE SEEN  Bacteria (UA) NONE SEEN  Epithelial Cells (UA) 1 /HPF (Result(s) reported on 25 Sep 2013 at 07:29PM.)  Routine Hem:  17-Dec-14 19:31   Erythrocyte Sed Rate 9 (Result(s) reported on 25 Sep 2013 at 08:27PM.)  18-Dec-14 04:14   WBC (CBC) 8.7  RBC (CBC) 4.22  Hemoglobin (CBC) 12.5  Hematocrit (CBC) 38.0  Platelet Count (CBC) 195  MCV 90  MCH 29.6  MCHC 32.8  RDW 12.1  Neutrophil % 61.0  Lymphocyte % 28.7  Monocyte % 8.9  Eosinophil %  1.2  Basophil % 0.2  Neutrophil # 5.3  Lymphocyte # 2.5  Monocyte # 0.8  Eosinophil # 0.1  Basophil # 0.0 (Result(s) reported on 26 Sep 2013 at  05:40AM.)   Radiology Results:  [place]:    18-Dec-14 14:30, US Carotid Doppler Bilateral  US Carotid Doppler Bilateral   REASON FOR EXAM:    cva  COMMENTS:       PROCEDURE: [place]  - US CAROTID DOPPLER BILATERAL  - Sep 26 2013  2:30PM     CLINICAL DATA:  Acute CVA    EXAM:  BILATERAL CAROTID DUPLEX ULTRASOUND    TECHNIQUE:  Pearline Cables scale imaging, color Doppler and duplex ultrasound were  performed of bilateral carotid and vertebral arteries in the neck.    COMPARISON:  None.  FINDINGS:  Criteria: Quantification of carotid stenosis is based on velocity  parameters that correlate the residual internal carotid diameter  withNASCET-based stenosis levels, using the diameter of the distal  internal carotid lumen as the denominator for stenosis measurement.    The following velocity measurements were obtained:    RIGHT    ICA:  86 cm/sec    CCA:  737 cm/sec    SYSTOLIC ICA/CCA RATIO:  1.06  DIASTOLIC ICA/CCA RATIO:  2.69    ECA:  7 9 cm/sec    LEFT    ICA:  86 cm/sec    CCA:  485 cm/sec    SYSTOLIC ICA/CCA RATIO:  4.62    DIASTOLIC ICA/CCA RATIO:  7.03    ECA:  55 cm/sec  RIGHT and left CAROTID ARTERIES: Small amount ofsmooth and  calcified plaque in both carotid bulbs with noncalcified plaque  elsewhere    RIGHT VERTEBRAL ARTERY:  Patent and normal in flow direction    LEFT VERTEBRAL ARTERY:  Patent and normal in flow direction     IMPRESSION:  1. There is no evidence of a hemodynamically significant carotid  stenosis.  2. The vertebral arteries are normal in flow direction bilaterally.      Electronically Signed    By: David  Martinique    On: 09/26/2013 15:41         Verified By: DAVID A. Martinique, M.D., MD  MRI:    18-Dec-14 14:48, MRI Brain Without Contrast  MRI Brain Without Contrast   REASON FOR EXAM:    Dysarthria/left sided weakness  COMMENTS:       PROCEDURE: MR  - MR BRAIN WO CONTRAST  - Sep 26 2013  2:48PM     CLINICAL DATA:  81 year old female with  left side weakness and  dysarthria. Initial encounter.    EXAM:  MRI HEAD WITHOUT CONTRAST    TECHNIQUE:  Multiplanar, multiecho pulse sequences of the brain and surrounding  structures were obtained without intravenous contrast.  COMPARISON:  Head CT 09/25/2013.    FINDINGS:  11 mm oval focus of restricted diffusion in the left paracentral  pons. Corresponding T2 and FLAIR hyperintensity. Corresponding  decreased T1 signal. No associated hemorrhage. No significant mass  effect.    No other restricted diffusion. Major intracranial vascular flow  voids are preserved.    Patchy in confluent cerebral white matter T2 and FLAIR  hyperintensity. T2 heterogeneity in the basal ganglia which appears  mostly to reflect increased perivascular spaces. Chronic lacunar  infarcts in both alum I, more so the left. No cortical  encephalomalacia identified. No ventriculomegaly. No midline shift,  mass effect, or evidence of intracranial mass lesion. Negative  pituitary, cervicomedullary junction and  visualized cervical spine.  Normal bone marrow signal.    Postoperative changes to the globes. Mild paranasal sinus mucosal  thickening and small mucous retention cysts. Trace mastoid fluid.  Negative visualized nasopharynx. Negative scalp soft tissues.     IMPRESSION:  1. Acute to subacute lacunar infarct in the left paracentral pons.  No associated hemorrhage or significant mass effect.  2. Underlying chronic small vessel disease.    Electronically Signed    By: Lars Pinks M.D.    On: 09/26/2013 15:07         Verified By: Gwenyth Bender. HALL, M.D.,  CT:    17-Dec-14 20:31, CT Head Without Contrast  CT Head Without Contrast   REASON FOR EXAM:    fall/speech changes  COMMENTS:       PROCEDURE: CT  - CT HEAD WITHOUT CONTRAST  - Sep 25 2013  8:31PM     CLINICAL DATA:  Fall, slurred speech    EXAM:  CT HEAD WITHOUT CONTRAST    TECHNIQUE:  Contiguous axial images were obtainedfrom the base  of the skull  through the vertex without intravenous contrast.    COMPARISON:  None.  FINDINGS:  No skull fracture is noted. Mild mucosal thickening right maxillary  sinus.    Moderate cerebral atrophy. Periventricular and subcortical white  matter decreased attenuation is probable due to chronic small vessel  ischemic changes. No definite acute cortical infarction. No mass  lesion is noted on this unenhanced scan.     IMPRESSION:  No acute intracranial abnormality. Moderate cerebral atrophy.  Periventricular and subcortical white matter decreased attenuation  probable due to chronic small vessel ischemic changes.    Electronically Signed    By: Lahoma Crocker M.D.    On: 09/25/2013 20:37         Verified By: Ephraim Hamburger, M.D.,   Impression/Recommendations: Recommendations:   81 year old woman with a complex medical history presents with decreased ambulation, decreased use of left leg and slurred speech.   is seen on Brain MRI to have an acute to subacute pontine infarct.  Her stroke likely began causing ischemic changes a few days ago corresponding to her first initial abnormal neurologic symptoms.  She is doing much better now, cognition is clear, though still with some mild LLE weakness.  Unclear if this is chronic or new.  Patient says she only wants to walk with a walker now, feels unsteady without it over the past few months, encouraged her to only ambulate with her walker given she is a falls risk.  Agree with continuing ASA 325 mg daily for secondary stroke prevention.  Stuttering nature concerning for cardioembolic process vs terminally stenoting vessel.  However, would not anticoagulate her given her age, comorbidities, falls risk and risk for bleeds.   STROKEContinue frequent neuro checks (Q4h) for first 24 hours and reassess frequency after that time.Brain MRI/A confirms ischemic CVA  - Carotid doppler - negative for clinically significant carotid artery stenosis - TTE with  bubble study to evaluate for intracardiac shunts or cardiac thrombiNo need to monitor for afib since not anticoagulation candidateCheck fasting lipid panel, TSH and HgbA1cCycle cardiac enzymes Cardiac monitoring as described above.Antiplatelet therapy: aspirin 325 mg PO daily TREATMENT:Per ATP III guidlines, given that patient has additional stroke risk factors, start or increase statin if LDL > 70 PRESSURE CONTROL:Allow permissive HTN for BP less than 220/110Restart home antihypertensive medications prior to discharge. PT, OT, Speech evaluationsConsult speech therapy for swallowing assessment and stroke education.No  dysarthria, no indication to make NPO.Fall PrecautionsCounseled against smoking.  DVT prophylaxis with [place] lovenox.    have reviewed the results of the most recent imaging studies, tests and labs as outlined above and answered all related questions. have personally viewed the patient's Brain MRI and it shows an acute to subacute pontine infarct. and coordinated plan of care with hospitalist.   Electronic Signatures: Anabel Bene (MD)  (Signed 19-Dec-14 00:26)  Authored: REFERRING PHYSICIAN, Primary Care Physician, Consult, History of Present Illness, Review of Systems, PAST MEDICAL/SURGICAL HISTORY, HOME MEDICATIONS, Current Medications, ALLERGIES, NURSING VITAL SIGNS, Physical Exam-, LAB RESULTS, RADIOLOGY RESULTS, Recommendations   Last Updated: 19-Dec-14 00:26 by Anabel Bene (MD)

## 2017-04-25 ENCOUNTER — Ambulatory Visit
Admission: RE | Admit: 2017-04-25 | Discharge: 2017-04-25 | Disposition: A | Discharge status: Home / Self Care | Payer: Medicare Other | Source: Ambulatory Visit | Attending: Internal Medicine | Admitting: Internal Medicine

## 2017-04-25 ENCOUNTER — Other Ambulatory Visit: Payer: Self-pay | Admitting: Internal Medicine

## 2017-04-25 ENCOUNTER — Ambulatory Visit: Payer: Self-pay

## 2017-04-25 DIAGNOSIS — R600 Localized edema: Secondary | ICD-10-CM

## 2017-04-25 DIAGNOSIS — R140 Abdominal distension (gaseous): Secondary | ICD-10-CM

## 2017-05-04 ENCOUNTER — Other Ambulatory Visit: Payer: Self-pay

## 2017-05-04 ENCOUNTER — Ambulatory Visit
Admission: RE | Admit: 2017-05-04 | Discharge: 2017-05-04 | Disposition: A | Discharge status: Home / Self Care | Payer: Medicare Other | Source: Ambulatory Visit | Attending: Internal Medicine | Admitting: Internal Medicine

## 2017-05-04 DIAGNOSIS — R600 Localized edema: Secondary | ICD-10-CM | POA: Diagnosis not present

## 2017-05-04 DIAGNOSIS — R140 Abdominal distension (gaseous): Secondary | ICD-10-CM | POA: Insufficient documentation

## 2017-05-20 ENCOUNTER — Emergency Department
Admission: EM | Admit: 2017-05-20 | Discharge: 2017-05-20 | Disposition: A | Discharge status: Home / Self Care | Payer: Medicare Other | Attending: Emergency Medicine | Admitting: Emergency Medicine

## 2017-05-20 ENCOUNTER — Emergency Department: Payer: Medicare Other

## 2017-05-20 DIAGNOSIS — R531 Weakness: Secondary | ICD-10-CM | POA: Insufficient documentation

## 2017-05-20 DIAGNOSIS — I5042 Chronic combined systolic (congestive) and diastolic (congestive) heart failure: Secondary | ICD-10-CM | POA: Insufficient documentation

## 2017-05-20 DIAGNOSIS — M25551 Pain in right hip: Secondary | ICD-10-CM | POA: Diagnosis not present

## 2017-05-20 DIAGNOSIS — Z79899 Other long term (current) drug therapy: Secondary | ICD-10-CM | POA: Insufficient documentation

## 2017-05-20 DIAGNOSIS — R5383 Other fatigue: Secondary | ICD-10-CM

## 2017-05-20 DIAGNOSIS — R400 Somnolence: Secondary | ICD-10-CM | POA: Diagnosis present

## 2017-05-20 LAB — CBC
HCT: 43.2 % (ref 35.0–47.0)
Hemoglobin: 14.2 g/dL (ref 12.0–16.0)
MCH: 29.6 pg (ref 26.0–34.0)
MCHC: 33 g/dL (ref 32.0–36.0)
MCV: 89.6 fL (ref 80.0–100.0)
PLATELETS: 184 10*3/uL (ref 150–440)
RBC: 4.82 MIL/uL (ref 3.80–5.20)
RDW: 12.9 % (ref 11.5–14.5)
WBC: 11.6 10*3/uL — ABNORMAL HIGH (ref 3.6–11.0)

## 2017-05-20 LAB — BASIC METABOLIC PANEL
ANION GAP: 9 (ref 5–15)
BUN: 18 mg/dL (ref 6–20)
CHLORIDE: 101 mmol/L (ref 101–111)
CO2: 31 mmol/L (ref 22–32)
Calcium: 8.7 mg/dL — ABNORMAL LOW (ref 8.9–10.3)
Creatinine, Ser: 0.62 mg/dL (ref 0.44–1.00)
GFR calc non Af Amer: >60 mL/min (ref >60)
GLUCOSE: 113 mg/dL — AB (ref 65–99)
POTASSIUM: 3.5 mmol/L (ref 3.5–5.1)
Sodium: 141 mmol/L (ref 135–145)

## 2017-05-20 LAB — TROPONIN I: Troponin I: 0.09 ng/mL (ref <0.03)

## 2017-05-20 MED ORDER — SODIUM CHLORIDE 0.9 % IV BOLUS (SEPSIS): 1000.0000 mL | Freq: Once | INTRAVENOUS | Status: DC

## 2017-05-20 MED ORDER — SODIUM CHLORIDE 0.9 % IV BOLUS (SEPSIS)
500.0000 mL | Freq: Once | INTRAVENOUS | Status: AC
  Administered 2017-05-20: 500 mL via INTRAVENOUS

## 2017-05-20 NOTE — ED Provider Notes (Signed)
[facility] Emergency Department Provider Note  Time seen: 7:25 AM  I have reviewed the triage vital signs and the nursing notes.   HISTORY  Chief Complaint Fatigue    HPI Kristin Graham is a 83 y.o. female with a past medical history of hyperlipidemia, peripheral edema, CHF, presents to the emergency department for increased somnolence. According to the patient and her daughter the patient has felt more fatigued over the past 2 or 3 days. She states her doctor increased her Lasix from 30 mg to 40 mg about 10 days ago. No other medication changes recently. Daughter denies any increased peripheral edema over baseline. She states the patient was acting more somnolent yesterday. She had fallen asleep in a recliner, but daughter went to help the patient to bed but the patient did not want to go to bed which the daughter states is very abnormal. She called EMS and was able to get the patient up and the patient states she does not want to go with them. The daughter was concerned and brought her to the ER for evaluation. Here the patient denies any complaints at this time. She is somnolent keeps her eyes closed most of the exam but does answer questions yes or no. States her right hip is hurting her, but she states this is just from the way she is laying. She is able to move the hip without any discomfort.  No past medical history on file.  There are no active problems to display for this patient.   No past surgical history on file.  Prior to Admission medications   Medication Sig Start Date End Date Taking? Authorizing Provider  atorvastatin (LIPITOR) 10 MG tablet Take 10 mg by mouth daily.   Yes [provider]  furosemide (LASIX) 40 MG tablet Take 40 mg by mouth daily.   Yes [provider]    Allergies  Allergen Reactions  . Bee Venom Swelling    No family history on file.  Social History Social History  Substance Use Topics  . Smoking  status: Not on file  . Smokeless tobacco: Not on file  . Alcohol use Not on file    Review of Systems Constitutional: Negative for fever. Negative for falls. Cardiovascular: Negative for chest pain. Respiratory: Negative for shortness of breath. Gastrointestinal: Negative for abdominal pain. Denies vomiting or diarrhea. Musculoskeletal: Chronic neck pain per daughter. Neurological: Stated mild headache earlier per daughter denies current. All other ROS negative  ____________________________________________   PHYSICAL EXAM:  VITAL SIGNS: ED Triage Vitals  Enc Vitals Group     BP 05/20/17 0631 132/76     Pulse Rate 05/20/17 0631 77     Resp 05/20/17 0631 20     Temp 05/20/17 0631 98 F (36.7 C)     Temp Source 05/20/17 0631 Oral     SpO2 05/20/17 0631 96 %     Weight 05/20/17 0632 200 lb 9.9 oz (91 kg)     Height 05/20/17 0632 5\' 8"  (1.727 m)     Head Circumference --      Peak Flow --      Pain Score --      Pain Loc --      Pain Edu? --      Excl. in GC? --    Constitutional: Awake, answers questions, but does keep her eyes closed most of the exam appears somewhat fatigued. No distress. Eyes: Normal exam ENT   Head: Normocephalic and atraumatic  Mouth/Throat: Mucous membranes are moist. Cardiovascular: Normal rate, regular rhythm. No murmur Respiratory: Normal respiratory effort without tachypnea nor retractions. Breath sounds are clear  Gastrointestinal: Soft and nontender. No distention.   Musculoskeletal: Nontender with normal range of motion in all extremities. Moderate lower extremity edema equal bilaterally. Daughter states unchanged from baseline or possibly improved. Neurologic:  Normal speech and language. No gross focal neurologic deficits  Skin:  Skin is warm, dry and intact.  Psychiatric: Mood and affect are normal.   ____________________________________________    EKG  EKG reviewed and interpreted by myself shows normal sinus rhythm at 77  bpm, narrow QRS, left axis deviation, large for normal intervals with slight PR prolongation. Nonspecific ST changes without elevation.  ____________________________________________    RADIOLOGY  IMPRESSION: 1. Findings suggestive of COPD. 2. Mild bibasilar atelectasis noted. 3. Mild cardiomegaly.  ____________________________________________   INITIAL IMPRESSION / ASSESSMENT AND PLAN / ED COURSE  Pertinent labs & imaging results that were available during my care of the patient were reviewed by me and considered in my medical decision making (see chart for details).  Patient presents the emergency department with symptoms/concerns of generalized fatigue and weakness since last night. Daughter states that the past several days of patient has been somewhat more fatigued. Currently the patient does appear somewhat fatigued however it is also very early in the morning when the patient did not sleep well last night. Patient is able to move all extremities without difficulty or pain. Clear lung sounds. Very normal-appearing vitals including a 96-98% room air saturation. We will check labs including urinalysis and troponin. We'll obtain a chest x-ray and closely monitor in the emergency department.  Patient's labs have resulted with a slight leukocytosis, normal renal function. Normal GFR. Patient's troponin is slightly elevated however this appears to be baseline for the patient. Urinalysis pending.  We attempted an in and out catheterization without success. Patient has received 500 cc of fluid in the emergency department. Kidney function is normal. I discussed this with the daughter, and we jointly agreed on holding off on further catheterization attempts. Of note the daughter was somewhat frustrated that she had to leave the room during the urinary catheterization procedure. I discussed with the daughter that we usually asked family members to leave the room during sterile or exposing  procedures. The attempt at catheterization was very brief and the daughter was allowed back into the room immediately following the procedure. Daughter states she will call Dr. Graciela Husbands tomorrow to arrange a follow-up appointment. Given the patient's increased fatigue since adjusting her Lasix from 30 mg to 40 mg, I recommended that she revert back to 30 mg tablets. Titers agreeable to plan. I also discussed return precautions for any continued somnolence (the patient's very awake and alert currently, daughter states the patient is asked to leave multiple times), slurred speech, confusion, weakness or numbness. Daughter agreeable.  ____________________________________________   FINAL CLINICAL IMPRESSION(S) / ED DIAGNOSES  Generalized weakness    Minna Antis, MD 05/20/17 [phone]

## 2017-05-20 NOTE — ED Triage Notes (Signed)
Patient to ED via EMS from home.  Per EMS family reports that patient has been retaining more fluid in her legs and PMD recently increased her Lasix.  Tonight patient less active and lethargic.

## 2017-05-20 NOTE — Discharge Instructions (Signed)
As we discussed please have the patient obtain plenty of rest. Return to the emergency department for any apparent changes in mental state such as confusion, significant somnolence, or if you notice any slurred speech, weakness or numbness of any arm or leg, or any other symptom personally concerning to yourself.

## 2017-05-22 NOTE — Care Management (Signed)
This patient is open to [facility] for home health. Resumption of care requested from [facility].

## 2017-07-07 ENCOUNTER — Emergency Department: Payer: Medicare Other

## 2017-07-07 ENCOUNTER — Observation Stay
Admission: EM | Admit: 2017-07-07 | Discharge: 2017-07-10 | Disposition: A | Discharge status: Skilled Nursing Facility | Payer: Medicare Other | Attending: Internal Medicine | Admitting: Internal Medicine

## 2017-07-07 DIAGNOSIS — Y92010 Kitchen of single-family (private) house as the place of occurrence of the external cause: Secondary | ICD-10-CM | POA: Diagnosis not present

## 2017-07-07 DIAGNOSIS — F0390 Unspecified dementia without behavioral disturbance: Secondary | ICD-10-CM | POA: Diagnosis not present

## 2017-07-07 DIAGNOSIS — Z7982 Long term (current) use of aspirin: Secondary | ICD-10-CM | POA: Insufficient documentation

## 2017-07-07 DIAGNOSIS — Z91030 Bee allergy status: Secondary | ICD-10-CM | POA: Insufficient documentation

## 2017-07-07 DIAGNOSIS — S3289XA Fracture of other parts of pelvis, initial encounter for closed fracture: Principal | ICD-10-CM | POA: Insufficient documentation

## 2017-07-07 DIAGNOSIS — D72829 Elevated white blood cell count, unspecified: Secondary | ICD-10-CM | POA: Diagnosis not present

## 2017-07-07 DIAGNOSIS — S329XXA Fracture of unspecified parts of lumbosacral spine and pelvis, initial encounter for closed fracture: Secondary | ICD-10-CM | POA: Diagnosis present

## 2017-07-07 DIAGNOSIS — W19XXXA Unspecified fall, initial encounter: Secondary | ICD-10-CM

## 2017-07-07 DIAGNOSIS — Z23 Encounter for immunization: Secondary | ICD-10-CM | POA: Diagnosis not present

## 2017-07-07 DIAGNOSIS — M160 Bilateral primary osteoarthritis of hip: Secondary | ICD-10-CM | POA: Insufficient documentation

## 2017-07-07 DIAGNOSIS — E785 Hyperlipidemia, unspecified: Secondary | ICD-10-CM

## 2017-07-07 DIAGNOSIS — S3282XA Multiple fractures of pelvis without disruption of pelvic ring, initial encounter for closed fracture: Secondary | ICD-10-CM | POA: Diagnosis present

## 2017-07-07 DIAGNOSIS — Z79899 Other long term (current) drug therapy: Secondary | ICD-10-CM | POA: Insufficient documentation

## 2017-07-07 DIAGNOSIS — H409 Unspecified glaucoma: Secondary | ICD-10-CM | POA: Insufficient documentation

## 2017-07-07 DIAGNOSIS — W1839XA Other fall on same level, initial encounter: Secondary | ICD-10-CM | POA: Diagnosis not present

## 2017-07-07 DIAGNOSIS — H269 Unspecified cataract: Secondary | ICD-10-CM

## 2017-07-07 DIAGNOSIS — M25551 Pain in right hip: Secondary | ICD-10-CM | POA: Diagnosis present

## 2017-07-07 DIAGNOSIS — Y9389 Activity, other specified: Secondary | ICD-10-CM | POA: Diagnosis not present

## 2017-07-07 DIAGNOSIS — G459 Transient cerebral ischemic attack, unspecified: Secondary | ICD-10-CM

## 2017-07-07 DIAGNOSIS — I509 Heart failure, unspecified: Secondary | ICD-10-CM

## 2017-07-07 DIAGNOSIS — M25559 Pain in unspecified hip: Secondary | ICD-10-CM | POA: Diagnosis present

## 2017-07-07 LAB — URINALYSIS, COMPLETE (UACMP) WITH MICROSCOPIC
BILIRUBIN URINE: NEGATIVE
Bacteria, UA: NONE SEEN
Glucose, UA: NEGATIVE mg/dL
Hgb urine dipstick: NEGATIVE
Ketones, ur: NEGATIVE mg/dL
Leukocytes, UA: NEGATIVE
Nitrite: NEGATIVE
PH: 5 (ref 5.0–8.0)
Protein, ur: NEGATIVE mg/dL
SPECIFIC GRAVITY, URINE: 1.004 — AB (ref 1.005–1.030)
SQUAMOUS EPITHELIAL / LPF: NONE SEEN

## 2017-07-07 LAB — BASIC METABOLIC PANEL
Anion gap: 12 (ref 5–15)
BUN: 14 mg/dL (ref 6–20)
CALCIUM: 8.8 mg/dL — AB (ref 8.9–10.3)
CO2: 26 mmol/L (ref 22–32)
CREATININE: 0.59 mg/dL (ref 0.44–1.00)
Chloride: 102 mmol/L (ref 101–111)
GFR calc Af Amer: >60 mL/min (ref >60)
GLUCOSE: 116 mg/dL — AB (ref 65–99)
POTASSIUM: 4.5 mmol/L (ref 3.5–5.1)
SODIUM: 140 mmol/L (ref 135–145)

## 2017-07-07 LAB — CBC
HCT: 43.4 % (ref 35.0–47.0)
Hemoglobin: 14.6 g/dL (ref 12.0–16.0)
MCH: 29.9 pg (ref 26.0–34.0)
MCHC: 33.6 g/dL (ref 32.0–36.0)
MCV: 89 fL (ref 80.0–100.0)
PLATELETS: 170 10*3/uL (ref 150–440)
RBC: 4.87 MIL/uL (ref 3.80–5.20)
RDW: 12.7 % (ref 11.5–14.5)
WBC: 20.1 10*3/uL — ABNORMAL HIGH (ref 3.6–11.0)

## 2017-07-07 MED ORDER — ACETAMINOPHEN 325 MG PO TABS
650.0000 mg | ORAL_TABLET | ORAL | Status: AC
  Administered 2017-07-07: 650 mg via ORAL
  Filled 2017-07-07: qty 2

## 2017-07-07 NOTE — ED Notes (Signed)
Pt assessed to be HIGH FALL RISK; yellow socks and yellow bracelet placed on pt at this time. Room curtain pulled back completely to allow for constant visualization by staff in lieu of personal bed alarm.

## 2017-07-07 NOTE — ED Provider Notes (Signed)
[facility] Emergency Department Provider Note   ____________________________________________   First MD Initiated Contact with Patient 07/07/17 2156     (approximate)  I have reviewed the triage vital signs and the nursing notes.   HISTORY  Chief Complaint Fall  EM caveat: Some limitations due to patient age  HPI Kristin Graham is a 83 y.o. female here for evaluation after falling. Patient had a fall at home, reports she was sitting in a chair and fell forward falling against her right hip and pelvis. She does have a history of dementia.  she denies head injury, denies any chest pain or trouble breathing. No injury to the arms or legs but reports pain over the right side of the hip and was unable to walk without pain at home after falling.   No past medical history on file.  There are no active problems to display for this patient.   No past surgical history on file.  Prior to Admission medications   Medication Sig Start Date End Date Taking? Authorizing Provider  acetaminophen (TYLENOL) 500 MG tablet Take 250 mg by mouth at bedtime.   Yes [provider]  bimatoprost (LUMIGAN) 0.03 % ophthalmic solution Place 1 drop into both eyes at bedtime.   Yes [provider]  dorzolamide-timolol (COSOPT) 22.3-6.8 MG/ML ophthalmic solution Place 1 drop into both eyes 2 (two) times daily.   Yes [provider]  traMADol (ULTRAM) 50 MG tablet Take 12.5 mg by mouth at bedtime. Reported by daughter: "1/4 of  tablet daily at bedtime"   Yes [provider]  atorvastatin (LIPITOR) 10 MG tablet Take 10 mg by mouth daily.    [provider]  furosemide (LASIX) 40 MG tablet Take 30 mg by mouth daily. Weekly  x5 days,  x2 days    [provider]    Allergies Bee venom  No family history on file.  Social History Social History  Substance Use Topics  . Smoking status: Not on file  . Smokeless  tobacco: Not on file  . Alcohol use Not on file  does not smoke drink or use illegal drugs  Review of Systems Constitutional: No fever/chills Eyes: No visual changes. ENT: No sore throat. Cardiovascular: Denies chest pain. Respiratory: Denies shortness of breath. Gastrointestinal: No abdominal pain.  Genitourinary: Negative for dysuria. Musculoskeletal: Negative for back pain. Skin: Negative for rash. Neurological: Negative for headaches, focal weakness or numbness.    ____________________________________________   PHYSICAL EXAM:  VITAL SIGNS: ED Triage Vitals [07/07/17 2137]  Enc Vitals Group     BP 135/83     Pulse Rate 71     Resp 17     Temp 97.7 F (36.5 C)     Temp Source Oral     SpO2 92 %     Weight 200 lb (90.7 kg)     Height  (1.651 m)     Head Circumference      Peak Flow      Pain Score      Pain Loc      Pain Edu?      Excl. in GC?     Constitutional: Alert and oriented. Well appearing and in no acute distress. Eyes: Conjunctivae are normal. Head: Atraumatic. Nose: No congestion/rhinnorhea. Mouth/Throat: Mucous membranes are moist. Neck: No stridor.   Cardiovascular: Normal rate, regular rhythm. Grossly normal heart sounds.  Good peripheral circulation. Respiratory: Normal respiratory effort.  No retractions. Lungs CTAB. Gastrointestinal: Soft  and nontender. No distention. Musculoskeletal:   RIGHT Right upper extremity demonstrates normal strength, good use of all muscles. No edema bruising or contusions of the right shoulder/upper arm, right elbow, right forearm / hand. Full range of motion of the right right upper extremity without pain. No evidence of trauma. Strong radial pulse. Intact median/ulnar/radial neuro-muscular exam.  LEFT Left upper extremity demonstrates normal strength, good use of all muscles. No edema bruising or contusions of the left shoulder/upper arm, left elbow, left forearm / hand. Full range of motion of the left   upper extremity without pain. No evidence of trauma. Strong radial pulse. Intact median/ulnar/radial neuro-muscular exam.  Lower Extremities  No edema. Normal DP/PT pulses bilateral with good cap refill.  Normal neuro-motor function lower extremities bilateral.  RIGHT Right lower extremity demonstrates normal strength, good use of all muscles. No bruising or contusions of the right hip, right knee, right ankle. Full range of motion of the right lower extremity without pain. No pain on axial loading. No evidence of trauma.patient does have mild pitting edema  LEFT Left lower extremity demonstrates normal strength, good use of all muscles. No bruising or contusions of the hip,  knee, ankle. Full range of motion of the left lower extremity but reports pain over the right pelvis. No pain on axial loading. capillary refill is normal. Patient does have mild pitting edema   Neurologic:  Normal speech and language. No gross focal neurologic deficits are appreciated.  Skin:  Skin is warm, dry and intact. No rash noted. Psychiatric: Mood and affect are normal. Speech and behavior are normal.  ____________________________________________   LABS (all labs ordered are listed, but only abnormal results are displayed)  Labs Reviewed  CBC - Abnormal; Notable for the following:       Result Value   WBC 20.1 (*)    All other components within normal limits  BASIC METABOLIC PANEL - Abnormal; Notable for the following:    Glucose, Bld 116 (*)    Calcium 8.8 (*)    All other components within normal limits  URINALYSIS, COMPLETE (UACMP) WITH MICROSCOPIC - Abnormal; Notable for the following:    Color, Urine YELLOW (*)    APPearance CLEAR (*)    Specific Gravity, Urine 1.004 (*)    All other components within normal limits   ____________________________________________  EKG  reviewed and interpreted by me at 2255 Heart rate 70 QRS 100 QTc 450 Normal sinus rhythm, occasional PVC, no evidence  of acute ischemia is noted, possible old anterior and inferior infarcts ____________________________________________  RADIOLOGY  Ct Head Wo Contrast  Result Date: 07/07/2017 CLINICAL DATA:  83 y/o  F; status post fall. EXAM: CT HEAD WITHOUT CONTRAST TECHNIQUE: Contiguous axial images were obtained from the base of the skull through the vertex without intravenous contrast. COMPARISON:  09/25/2013 CT of the head. FINDINGS: Brain: No evidence of acute infarction, hemorrhage, hydrocephalus, extra-axial collection or mass lesion/mass effect. Stable moderate chronic microvascular ischemic changes parenchymal volume loss of the brain for age. Vascular: Calcific atherosclerosis of carotid siphons. No hyperdense vessel. Skull: Normal. Negative for fracture or focal lesion. Sinuses/Orbits: Mild mucosal thickening of the maxillary sinuses with small mucous retention cysts is stable. The normal aeration of mastoid air cells. Bilateral intra-ocular lens replacement. Other: None. IMPRESSION: 1. No acute intracranial abnormality or calvarial fracture. 2. Stable moderate for age chronic microvascular ischemic changes and moderate parenchymal volume loss of the brain. 3. Stable mild maxillary sinus disease. Electronically Signed   By: Micah Noel  [place]Strat[place] M.D.   On: 07/07/2017 22:51   Dg Hip Unilat  With Pelvis 2-3 Views Right  Result Date: 07/07/2017 CLINICAL DATA:  83 y/o  F; status post fall with right hip pain. EXAM: DG HIP (WITH OR WITHOUT PELVIS) 2-3V RIGHT COMPARISON:  None. FINDINGS: The bones are demineralized. Cortical lucency within the right iliopectineal line. No proximal femur fracture identified. No hip dislocation. Moderate right hip osteoarthrosis with joint space narrowing. Symphysis pubis and sacroiliac joints are maintained. Vascular calcification. IMPRESSION: 1. Cortical lucency within right iliopectineal line may represent a lateral superior pubic ramus/anterior column of acetabulum fracture. 2.  No proximal femur fracture identified. If clinically indicated, consider CT or MRI as radiographs can have low sensitivity in the elderly. Electronically Signed   By: Mitzi Hansen M.D.   On: 07/07/2017 22:59   CT and x-ray reviewed by me, no clear evidence of intracranial injury. X-ray questionable pelvic fracture  ____________________________________________   PROCEDURES  Procedure(s) performed: None  Procedures  Critical Care performed: No  ____________________________________________   INITIAL IMPRESSION / ASSESSMENT AND PLAN / ED COURSE  Pertinent labs & imaging results that were available during my care of the patient were reviewed by me and considered in my medical decision making (see chart for details).  patient presents for evaluation after a fall. Unwitnessed, but the patient does seem to have good recollection of falling reports she did not hit her head but given her history of dementia we'll obtain a CT head to exclude intracranial trauma. Her nares are stable, no chest or abdominal complaints but does report pain over the right side of the pelvis suspicious for possible pelvic fracture on x-ray.  ----------------------------------------- 11:59 PM on 07/07/2017 -----------------------------------------   Dr. Joice Lofts seen the patient in the ER, recommends CT of the pelvis without contrast. he recommends weightbearing as tolerated, but feels likely patient will require admission due to age and the pain will be present with difficulties and pain control associated with pelvic fractures. Care assigned to Dr. Zenda Alpers, follow-up on CT of the pelvis as well as chest x-ray. Patient has significant pelvic fractures would recommend inpatient admission for pain control and physical therapy as the patient is likely to have significant challenges with walking and ambulation given her living at home status and baseline use of a walker.       ____________________________________________   FINAL CLINICAL IMPRESSION(S) / ED DIAGNOSES  Final diagnoses:  Pelvic joint pain, right  Leukocytosis, unspecified type      NEW MEDICATIONS STARTED DURING THIS VISIT:  New Prescriptions   No medications on file     Note:  This document was prepared using Dragon voice recognition software and may include unintentional dictation errors.     Sharyn Creamer, MD 07/08/17 [phone]

## 2017-07-07 NOTE — ED Triage Notes (Signed)
Pt arrives to ED via [facility] from home s/p fall. EMS reports the pt was sitting in a chair and fell forward when leaving over to pick up a napkin. Pt with h/x of dementia and lives alone; daughter/acregiver lives next door. EMS reports pt c/o pain the the RIGHT hip and thigh; no obvious swelling, deformity, shortening, or rotation observed. Pt is alert to self and situation and able to report how the accident happened; denies head injury or LOC. RR even, regular, and unlabored; skin color/temp is WNL.

## 2017-07-08 ENCOUNTER — Emergency Department: Payer: Medicare Other

## 2017-07-08 DIAGNOSIS — G459 Transient cerebral ischemic attack, unspecified: Secondary | ICD-10-CM

## 2017-07-08 DIAGNOSIS — H269 Unspecified cataract: Secondary | ICD-10-CM

## 2017-07-08 DIAGNOSIS — E785 Hyperlipidemia, unspecified: Secondary | ICD-10-CM

## 2017-07-08 DIAGNOSIS — I509 Heart failure, unspecified: Secondary | ICD-10-CM

## 2017-07-08 DIAGNOSIS — S329XXA Fracture of unspecified parts of lumbosacral spine and pelvis, initial encounter for closed fracture: Secondary | ICD-10-CM | POA: Diagnosis present

## 2017-07-08 LAB — BASIC METABOLIC PANEL
Anion gap: 10 (ref 5–15)
BUN: 15 mg/dL (ref 6–20)
CALCIUM: 8.5 mg/dL — AB (ref 8.9–10.3)
CO2: 32 mmol/L (ref 22–32)
CREATININE: 0.56 mg/dL (ref 0.44–1.00)
Chloride: 100 mmol/L — ABNORMAL LOW (ref 101–111)
GFR calc non Af Amer: >60 mL/min (ref >60)
Glucose, Bld: 137 mg/dL — ABNORMAL HIGH (ref 65–99)
Potassium: 3.7 mmol/L (ref 3.5–5.1)
SODIUM: 142 mmol/L (ref 135–145)

## 2017-07-08 LAB — CBC
HCT: 41.4 % (ref 35.0–47.0)
Hemoglobin: 13.9 g/dL (ref 12.0–16.0)
MCH: 30.6 pg (ref 26.0–34.0)
MCHC: 33.6 g/dL (ref 32.0–36.0)
MCV: 91.3 fL (ref 80.0–100.0)
PLATELETS: 164 10*3/uL (ref 150–440)
RBC: 4.54 MIL/uL (ref 3.80–5.20)
RDW: 12.5 % (ref 11.5–14.5)
WBC: 13.3 10*3/uL — ABNORMAL HIGH (ref 3.6–11.0)

## 2017-07-08 MED ORDER — LATANOPROST 0.005 % OP SOLN
1.0000 [drp] | Freq: Every day | OPHTHALMIC | Status: DC
  Administered 2017-07-08 – 2017-07-09 (×2): 1 [drp] via OPHTHALMIC
  Filled 2017-07-08: qty 2.5

## 2017-07-08 MED ORDER — ACETAMINOPHEN 650 MG RE SUPP
650.0000 mg | Freq: Four times a day (QID) | RECTAL | Status: DC | PRN
Start: 2017-07-08 — End: 2017-07-10

## 2017-07-08 MED ORDER — FUROSEMIDE 20 MG PO TABS: 30.0000 mg | ORAL_TABLET | Freq: Every day | ORAL | Status: DC

## 2017-07-08 MED ORDER — MORPHINE SULFATE (PF) 2 MG/ML IV SOLN: 0.5000 mg | INTRAVENOUS | Status: DC | PRN

## 2017-07-08 MED ORDER — VITAMIN E 180 MG (400 UNIT) PO CAPS
400.0000 [IU] | ORAL_CAPSULE | Freq: Every day | ORAL | Status: DC
  Administered 2017-07-08 – 2017-07-10 (×3): 400 [IU] via ORAL
  Filled 2017-07-08 (×3): qty 1

## 2017-07-08 MED ORDER — POLYETHYLENE GLYCOL 3350 17 G PO PACK
17.0000 g | PACK | Freq: Every day | ORAL | Status: DC | PRN
  Administered 2017-07-10: 17 g via ORAL
  Filled 2017-07-08: qty 1

## 2017-07-08 MED ORDER — ONDANSETRON HCL 4 MG/2ML IJ SOLN: 4.0000 mg | Freq: Four times a day (QID) | INTRAMUSCULAR | Status: DC | PRN

## 2017-07-08 MED ORDER — FUROSEMIDE 20 MG PO TABS
30.0000 mg | ORAL_TABLET | ORAL | Status: DC
  Administered 2017-07-08 – 2017-07-09 (×2): 30 mg via ORAL
  Filled 2017-07-08 (×2): qty 2

## 2017-07-08 MED ORDER — INFLUENZA VAC SPLIT HIGH-DOSE 0.5 ML IM SUSY
0.5000 mL | PREFILLED_SYRINGE | INTRAMUSCULAR | Status: AC
  Administered 2017-07-09: 0.5 mL via INTRAMUSCULAR
  Filled 2017-07-08 (×2): qty 0.5

## 2017-07-08 MED ORDER — DORZOLAMIDE HCL-TIMOLOL MAL 2-0.5 % OP SOLN
1.0000 [drp] | Freq: Two times a day (BID) | OPHTHALMIC | Status: DC
  Administered 2017-07-08 – 2017-07-10 (×5): 1 [drp] via OPHTHALMIC
  Filled 2017-07-08: qty 10

## 2017-07-08 MED ORDER — ACETAMINOPHEN 325 MG PO TABS
650.0000 mg | ORAL_TABLET | Freq: Four times a day (QID) | ORAL | Status: DC | PRN
  Administered 2017-07-08 – 2017-07-09 (×2): 650 mg via ORAL
  Filled 2017-07-08 (×2): qty 2

## 2017-07-08 MED ORDER — VITAMIN E 400 UNITS PO TABS
1.0000 | ORAL_TABLET | Freq: Every day | ORAL | Status: DC
Start: 2017-07-08 — End: 2017-07-08

## 2017-07-08 MED ORDER — ASPIRIN 325 MG PO TABS
325.0000 mg | ORAL_TABLET | Freq: Every day | ORAL | Status: DC
  Administered 2017-07-08 – 2017-07-10 (×3): 325 mg via ORAL
  Filled 2017-07-08 (×6): qty 1

## 2017-07-08 MED ORDER — TRAMADOL HCL 50 MG PO TABS
25.0000 mg | ORAL_TABLET | Freq: Every day | ORAL | Status: DC
  Administered 2017-07-08: 25 mg via ORAL
  Filled 2017-07-08: qty 1

## 2017-07-08 MED ORDER — ONDANSETRON HCL 4 MG PO TABS: 4.0000 mg | ORAL_TABLET | Freq: Four times a day (QID) | ORAL | Status: DC | PRN

## 2017-07-08 MED ORDER — B COMPLEX-C PO TABS
1.0000 | ORAL_TABLET | Freq: Every day | ORAL | Status: DC
  Administered 2017-07-08 – 2017-07-10 (×3): 1 via ORAL
  Filled 2017-07-08 (×3): qty 1

## 2017-07-08 MED ORDER — FUROSEMIDE 40 MG PO TABS: 40.0000 mg | ORAL_TABLET | ORAL | Status: DC

## 2017-07-08 MED ORDER — VITAMIN B-12 100 MCG PO TABS
100.0000 ug | ORAL_TABLET | Freq: Every day | ORAL | Status: DC
  Administered 2017-07-08 – 2017-07-10 (×3): 100 ug via ORAL
  Filled 2017-07-08 (×3): qty 1

## 2017-07-08 MED ORDER — ENOXAPARIN SODIUM 40 MG/0.4ML ~~LOC~~ SOLN
40.0000 mg | SUBCUTANEOUS | Status: DC
  Administered 2017-07-08 – 2017-07-10 (×3): 40 mg via SUBCUTANEOUS
  Filled 2017-07-08 (×3): qty 0.4

## 2017-07-08 MED ORDER — ATORVASTATIN CALCIUM 20 MG PO TABS
10.0000 mg | ORAL_TABLET | Freq: Every day | ORAL | Status: DC
  Administered 2017-07-08 – 2017-07-09 (×2): 10 mg via ORAL
  Filled 2017-07-08 (×2): qty 1

## 2017-07-08 MED ORDER — ZINC SULFATE 220 (50 ZN) MG PO CAPS
220.0000 mg | ORAL_CAPSULE | Freq: Every day | ORAL | Status: DC
  Administered 2017-07-08 – 2017-07-10 (×3): 220 mg via ORAL
  Filled 2017-07-08 (×3): qty 1

## 2017-07-08 NOTE — H&P (Signed)
PCP:   Kristin Ferrier, MD   Chief Complaint:  fall  HPI: [name] who lives at home, today showed a mechanical fall while getting up from the kitchen table. She did hit her head. Her daughter was present and she was brought to the ER.  Review of Systems:  The patient denies anorexia, fever, weight loss,, vision loss, decreased hearing, hoarseness, chest pain, syncope, dyspnea on exertion, peripheral edema, balance deficits, hemoptysis, abdominal pain, melena, hematochezia, severe indigestion/heartburn, hematuria, incontinence,fall, genital sores, muscle weakness, suspicious skin lesions, transient blindness, difficulty walking, depression, unusual weight change, abnormal bleeding, enlarged lymph nodes, angioedema, and breast masses.  Past Medical History: No past medical history on file. No past surgical history on file.  Medications: Prior to Admission medications   Medication Sig Start Date End Date Taking? Authorizing Provider  acetaminophen (TYLENOL) 500 MG tablet Take 250 mg by mouth at bedtime.   Yes [provider]  aspirin 325 MG tablet Take 325 mg by mouth daily.   Yes [provider]  atorvastatin (LIPITOR) 10 MG tablet Take 10 mg by mouth daily.   Yes [provider]  B Complex Vitamins (VITAMIN-B COMPLEX) TABS Take 1 tablet by mouth daily.   Yes [provider]  bimatoprost (LUMIGAN) 0.03 % ophthalmic solution Place 1 drop into both eyes at bedtime.   Yes [provider]  camphor-menthol Wynelle Fanny) lotion Apply topically. 07/21/16 07/21/17 Yes [provider]  dorzolamide-timolol (COSOPT) 22.3-6.8 MG/ML ophthalmic solution Place 1 drop into both eyes 2 (two) times daily.   Yes [provider]  furosemide (LASIX) 40 MG tablet Take 30 mg by mouth daily. Weekly  x5 days,  x2 days   Yes [provider]  Garlic 1000 MG CAPS Take by mouth.   Yes [provider]  Ginkgo Biloba  40 MG TABS Take by mouth.   Yes [provider]  Methylsulfonylmethane (MSM) 500 MG CAPS Take by mouth.   Yes [provider]  traMADol (ULTRAM) 50 MG tablet Take 12.5 mg by mouth at bedtime. Reported by daughter: "1/4 of  tablet daily at bedtime"   Yes [provider]  vitamin B-12 (CYANOCOBALAMIN) 100 MCG tablet Take 100 mcg by mouth daily.   Yes [provider]  Vitamin E 400 units TABS Take 1 tablet by mouth daily.   Yes [provider]  Zinc Sulfate (ZINC-220 PO) Take 1 tablet by mouth daily.   Yes [provider]    Allergies:   Allergies  Allergen Reactions  . Bee Venom Swelling    Social History: Negative tobacco, alcohol or illicit drugs. She lives at home. Her daughter is herattorney. Kristin Graham [phone]  Family History: hypertension  Physical Exam: Vitals:   07/07/17 2137 07/07/17 2255 07/08/17 0046  BP: 135/83 (!) 145/80 (!) 143/76  Pulse: 71 71 84  Resp: Temp: 97.7 F (36.5 C)    TempSrc: Oral    SpO2: 92% 95% 93%  Weight: 90.7 kg (200 lb)    Height:  (1.651 m)      General:  Alert and oriented times three, well developed and nourished, no acute distress Eyes: PERRLA, pink conjunctiva, no scleral icterus ENT: Moist oral mucosa, neck supple, no thyromegaly Lungs: clear to ascultation, no wheeze, no crackles, no use of accessory muscles Cardiovascular: regular rate and rhythm, no regurgitation, no gallops, no murmurs. No carotid bruits, no JVD Abdomen: soft, positive BS, non-tender, non-distended, no  organomegaly, not an acute abdomen GU: not examined Neuro: CN II - XII grossly intact, sensation intact Musculoskeletal: strength 5/5 all extremities, no clubbing, cyanosis or edema, range of motion is not tested due to tenderness Skin: no rash, no subcutaneous crepitation, no decubitus Psych: appropriate patient   Labs on Admission:   Recent Labs  07/07/17 2310  NA 140  K 4.5   CL 102  CO2 26  GLUCOSE 116*  BUN 14  CREATININE 0.59  CALCIUM 8.8*   No results for input(s): AST, ALT, ALKPHOS, BILITOT, PROT, ALBUMIN in the last 72 hours. No results for input(s): LIPASE, AMYLASE in the last 72 hours.  Recent Labs  07/07/17 2310  WBC 20.1*  HGB 14.6  HCT 43.4  MCV 89.0  PLT 170   No results for input(s): CKTOTAL, CKMB, CKMBINDEX, TROPONINI in the last 72 hours. Invalid input(s): POCBNP No results for input(s): DDIMER in the last 72 hours. No results for input(s): HGBA1C in the last 72 hours. No results for input(s): CHOL, HDL, LDLCALC, TRIG, CHOLHDL, LDLDIRECT in the last 72 hours. No results for input(s): TSH, T4TOTAL, T3FREE, THYROIDAB in the last 72 hours.  Invalid input(s): FREET3 No results for input(s): VITAMINB12, FOLATE, FERRITIN, TIBC, IRON, RETICCTPCT in the last 72 hours.  Micro Results: No results found for this or any previous visit (from the past 240 hour(s)).   Radiological Exams on Admission: Dg Chest 1 View  Result Date: 07/08/2017 CLINICAL DATA:  83 y/o F; fall with elevated white blood cell count. EXAM: CHEST 1 VIEW COMPARISON:  05/20/2017 chest radiograph FINDINGS: Stable mild cardiomegaly. Aortic atherosclerosis with calcification. Hyperinflated lungs with flattened diaphragms suggestive of COPD. No focal consolidation. Stable biapical nodular pleuroparenchymal scarring. No pleural effusion or pneumothorax. No acute osseous abnormality is evident. IMPRESSION: No active disease. Stable mild cardiomegaly. Aortic atherosclerosis. Stable findings suggesting COPD. Electronically Signed   By: Mitzi Hansen M.D.   On: 07/08/2017 01:20   Ct Head Wo Contrast  Result Date: 07/07/2017 CLINICAL DATA:  83 y/o  F; status post fall. EXAM: CT HEAD WITHOUT CONTRAST TECHNIQUE: Contiguous axial images were obtained from the base of the skull through the vertex without intravenous contrast. COMPARISON:  09/25/2013 CT of the head. FINDINGS:  Brain: No evidence of acute infarction, hemorrhage, hydrocephalus, extra-axial collection or mass lesion/mass effect. Stable moderate chronic microvascular ischemic changes parenchymal volume loss of the brain for age. Vascular: Calcific atherosclerosis of carotid siphons. No hyperdense vessel. Skull: Normal. Negative for fracture or focal lesion. Sinuses/Orbits: Mild mucosal thickening of the maxillary sinuses with small mucous retention cysts is stable. The normal aeration of mastoid air cells. Bilateral intra-ocular lens replacement. Other: None. IMPRESSION: 1. No acute intracranial abnormality or calvarial fracture. 2. Stable moderate for age chronic microvascular ischemic changes and moderate parenchymal volume loss of the brain. 3. Stable mild maxillary sinus disease. Electronically Signed   By: Mitzi Hansen M.D.   On: 07/07/2017 22:51   Ct Pelvis Wo Contrast  Result Date: 07/08/2017 CLINICAL DATA:  83 year old with right hip pain after fall. EXAM: CT PELVIS WITHOUT CONTRAST TECHNIQUE: Multidetector CT imaging of the pelvis was performed following the standard protocol without intravenous contrast. COMPARISON:  Radiographs yesterday FINDINGS: Urinary Tract: Distal ureters are decompressed. The bladder is distended without wall thickening. Bowel: No bowel inflammation. Moderate stool in the included colon. Vascular/Lymphatic: Atherosclerosis.  No evidence pelvic adenopathy. Reproductive: Uterine calcification may be fibroid. No adnexal mass. Other:  No pelvic ascites. Musculoskeletal: Acute fracture of the right  superior puboacetabular junction, essentially nondisplaced. No definite extension of the weight-bearing articular surface. Minimally displaced right inferior pubic ramus fracture. Bones are diffusely under mineralized. Moderate osteoarthritis of both hips, with bilateral hip joint effusions. Chronic enthesopathic change of both the ischial tuberosities. 5 cm fluid density structure  adjacent to the anterior proximal femur measures simple fluid density, likely fluid distending the bursa, chronic and similar to prior PET 04/07/2010. IMPRESSION: 1. Right pelvic fractures, nondisplaced involving the right puboacetabular junction and minimally displaced involving the inferior right pubic ramus. 2. Diffuse bony under mineralization. 3. Bilateral hip osteoarthritis and joint effusions. Probable bursal formation adjacent the right proximal femur that is chronic and similar to remote prior PET. Electronically Signed   By: Rubye Oaks M.D.   On: 07/08/2017 00:44   Dg Hip Unilat  With Pelvis 2-3 Views Right  Result Date: 07/07/2017 CLINICAL DATA:  83 y/o  F; status post fall with right hip pain. EXAM: DG HIP (WITH OR WITHOUT PELVIS) 2-3V RIGHT COMPARISON:  None. FINDINGS: The bones are demineralized. Cortical lucency within the right iliopectineal line. No proximal femur fracture identified. No hip dislocation. Moderate right hip osteoarthrosis with joint space narrowing. Symphysis pubis and sacroiliac joints are maintained. Vascular calcification. IMPRESSION: 1. Cortical lucency within right iliopectineal line may represent a lateral superior pubic ramus/anterior column of acetabulum fracture. 2. No proximal femur fracture identified. If clinically indicated, consider CT or MRI as radiographs can have low sensitivity in the elderly. Electronically Signed   By: Mitzi Hansen M.D.   On: 07/07/2017 22:59    Assessment/Plan Present on Admission: . Pelvic fracture (HCC) -admit to MedSurg -Due to mechanical fall -Pain medications when necessary -Orthopedics has seen's patient, medical therapy recommended -PT consult for orthopedics direction  Dyslipidemia -Continue patient statins per daughter's request  History of CHF/fluid overload -no active exacerbation in past. Continue by mouth Lasix  glaucoma -Continue medications  Kristin Graham 07/08/2017, 3:06 AM

## 2017-07-08 NOTE — Progress Notes (Signed)
Subjective :Patient is day one admission for right superior pubic ramus fracture Patient reports pain as moderate.   no nausea and no vomiting Denies any chest pains or shortness of breath. Patient states that is all she doesn't move the right leg she is comfortable. Patient states that she has a history of right leg pain prior to this fall. Mainly to the calf and ankle. Thinks that she is seen with some by 4 but is not sure.   Objective: Vital signs in last 24 hours: Temp:  [97.7 F (36.5 C)-98 F (36.7 C)] 98 F (36.7 C) (09/29 0417) Pulse Rate:  [71-88] 88 (09/29 0417) Resp:  [17-20] 20 (09/29 0308) BP: (133-163)/(75-83) 163/79 (09/29 0417) SpO2:  [92 %-96 %] 93 % (09/29 0417) Weight:  [90.7 kg (200 lb)] 90.7 kg (200 lb) (09/28 2137) Patient did have significant swelling to the lower extremity bilaterally. This appears to be chronic in nature. Tender to palpation to the calf it anywhere that she touch her of the right lower extremity. No external rotation of the lower extremity noted. Patient  slow on answering questions and appears to be somewhat confused.  Intake/Output from previous day: 09/28 0701 - 09/29 0700 In: 240 [P.O.:240] Out: -  Intake/Output this shift: No intake/output data recorded.   Recent Labs  07/07/17 2310 07/08/17 0433  HGB 14.6 13.9    Recent Labs  07/07/17 2310 07/08/17 0433  WBC 20.1* 13.3*  RBC 4.87 4.54  HCT 43.4 41.4  PLT 170 164    Recent Labs  07/07/17 2310 07/08/17 0433  NA 140 142  K 4.5 3.7  CL 102 100*  CO2 26 32  BUN 14 15  CREATININE 0.59 0.56  GLUCOSE 116* 137*  CALCIUM 8.8* 8.5*   No results for input(s): LABPT, INR in the last 72 hours.  Neurologically intact Neurovascular intact Sensation intact distally  Assessment/Plan: Right pubic ramus fracture. CT of the right hip and pelvic did not reveal any fractures into the weightbearing surface of the right acetabulum. Case management to assist with discharge  planning Physical therapy today. May weight-bear as tolerated Bowel movement today Labs in am Plan to discharge when medically clear   Bronnie Vasseur R. 07/08/2017, 8:41 AM

## 2017-07-08 NOTE — ED Provider Notes (Signed)
-----------------------------------------   1:53 AM on 07/08/2017 -----------------------------------------   Blood pressure (!) 143/76, pulse 84, temperature 97.7 F (36.5 C), temperature source Oral, resp. rate 20, height  (1.651 m), weight 90.7 kg (200 lb), SpO2 93 %.  Assuming care from Dr. Fanny Bien.  In short, Kristin Graham is a 83 y.o. female with a chief complaint of Fall .  Refer to the original H&P for additional details.  The current plan of care is to follow up the results of the CT scan and CXR.      I did review the CT scan and it appears that the patient has some right pelvic fractures that are nondisplaced involving the right pupil acetabular junction. She also has some bilateral hypoxia arthritis with joint effusions. The patient's chest x-ray is unremarkable. I will admit her to the hospital for pelvic fractures and inability to ambulate.   Rebecka Apley, MD 07/08/17 [phone]

## 2017-07-08 NOTE — ED Notes (Addendum)
Pt transported to room 159 

## 2017-07-08 NOTE — ED Notes (Addendum)
Pedal pulses audible bilaterally via Doppler and locations marked with surgical pen.

## 2017-07-08 NOTE — Consult Note (Signed)
ORTHOPAEDIC CONSULTATION  REQUESTING PHYSICIAN: Sharyn Creamer, MD  Chief Complaint:   Right hip pain.  History of Present Illness: Kristin Graham is a 83 y.o. female with a history of congestive heart failure and urinary incontinence who lives independently in her home but under the close supervision of her daughter who provides total care for her and lives in a house adjacent to hers.the patient normally ambulates with a walker. The patient was in her usual state of health this evening when she apparently tried to get up from the kitchen table after having a snack. Her napkin fell from her lap so she tried to reach down to pick it up. She lost her balance and fell onto her right hip. She was unable to get up so she was brought to the emergency room by EMS. X-rays in the emergency room demonstrated an essentially nondisplaced superior pubic ramus fracture with possible extension into the medial acetabular wall. The patient denies any associated injuries. She did not strike her head or lose consciousness. She denies any lightheadedness, dizziness, chest pain, shortness of breath, or other symptoms that may have precipitated her fall.  No past medical history on file. No past surgical history on file. Social History   Social History  . Marital status: Widowed    Spouse name: N/A  . Number of children: N/A  . Years of education: N/A   Social History Main Topics  . Smoking status: Not on file  . Smokeless tobacco: Not on file  . Alcohol use Not on file  . Drug use: Unknown  . Sexual activity: Not on file   Other Topics Concern  . Not on file   Social History Narrative  . No narrative on file   No family history on file. Allergies  Allergen Reactions  . Bee Venom Swelling   Prior to Admission medications   Medication Sig Start Date End Date Taking? Authorizing Provider  acetaminophen (TYLENOL) 500 MG tablet Take 250 mg  by mouth at bedtime.   Yes [provider]  bimatoprost (LUMIGAN) 0.03 % ophthalmic solution Place 1 drop into both eyes at bedtime.   Yes [provider]  dorzolamide-timolol (COSOPT) 22.3-6.8 MG/ML ophthalmic solution Place 1 drop into both eyes 2 (two) times daily.   Yes [provider]  traMADol (ULTRAM) 50 MG tablet Take 12.5 mg by mouth at bedtime. Reported by daughter: "1/4 of  tablet daily at bedtime"   Yes [provider]  atorvastatin (LIPITOR) 10 MG tablet Take 10 mg by mouth daily.    [provider]  furosemide (LASIX) 40 MG tablet Take 30 mg by mouth daily. Weekly  x5 days,  x2 days    [provider]   Ct Head Wo Contrast  Result Date: 07/07/2017 CLINICAL DATA:  83 y/o  F; status post fall. EXAM: CT HEAD WITHOUT CONTRAST TECHNIQUE: Contiguous axial images were obtained from the base of the skull through the vertex without intravenous contrast. COMPARISON:  09/25/2013 CT of the head. FINDINGS: Brain: No evidence of acute infarction, hemorrhage, hydrocephalus, extra-axial collection or mass lesion/mass effect. Stable moderate chronic microvascular ischemic changes parenchymal volume loss of the brain for age. Vascular: Calcific atherosclerosis of carotid siphons. No hyperdense vessel. Skull: Normal. Negative for fracture or focal lesion. Sinuses/Orbits: Mild mucosal thickening of the maxillary sinuses with small mucous retention cysts is stable. The normal aeration of mastoid air cells. Bilateral intra-ocular lens replacement. Other: None. IMPRESSION: 1. No acute intracranial abnormality or calvarial fracture.  2. Stable moderate for age chronic microvascular ischemic changes and moderate parenchymal volume loss of the brain. 3. Stable mild maxillary sinus disease. Electronically Signed   By: Mitzi Hansen M.D.   On: 07/07/2017 22:51   Dg Hip Unilat  With Pelvis 2-3 Views Right  Result Date: 07/07/2017 CLINICAL  DATA:  83 y/o  F; status post fall with right hip pain. EXAM: DG HIP (WITH OR WITHOUT PELVIS) 2-3V RIGHT COMPARISON:  None. FINDINGS: The bones are demineralized. Cortical lucency within the right iliopectineal line. No proximal femur fracture identified. No hip dislocation. Moderate right hip osteoarthrosis with joint space narrowing. Symphysis pubis and sacroiliac joints are maintained. Vascular calcification. IMPRESSION: 1. Cortical lucency within right iliopectineal line may represent a lateral superior pubic ramus/anterior column of acetabulum fracture. 2. No proximal femur fracture identified. If clinically indicated, consider CT or MRI as radiographs can have low sensitivity in the elderly. Electronically Signed   By: Mitzi Hansen M.D.   On: 07/07/2017 22:59    Positive ROS: All other systems have been reviewed and were otherwise negative with the exception of those mentioned in the HPI and as above.  Physical Exam: General:  Alert, no acute distress Psychiatric:  Patient is competent for consent with normal mood and affect   Cardiovascular:  No pedal edema Respiratory:  No wheezing, non-labored breathing GI:  Abdomen is soft and non-tender Skin:  No lesions in the area of chief complaint Neurologic:  Sensation intact distally Lymphatic:  No axillary or cervical lymphadenopathy  Orthopedic Exam:  Orthopedic examination is limited to the right hip and lower extremity. Skin inspection around the right hip is unremarkable. There is no swelling, erythema, ecchymosis, abrasions, or other skin abnormalities identified. She has tenderness to palpation in the anterior aspect of her right hip. She also has pain with attempted active or passive motion of the right hip, such as trying to logroll the right lower extremity. Examination of the lower leg demonstrates significant lower extremity edema. She is able to actively dorsiflex and plantarflex her toes and ankle. Sensation is intact to  light touch to all distributions. She has good capillary refill to her right foot.  X-rays:  X-rays of the pelvis and right hip are available for review. The findings are as described above.  Assessment: Nondisplaced superior pubic ramus fracture.  Plan: The treatment options are discussed with the patient her daughter, and her son-in-law. I agree with the radiologist's recommendation as to proceed with obtaining a CT scan of the pelvis and right hip to rule out any other occult fractures or to rule out extension of the pubic ramus fracture into the acetabulum. Assuming that the fracture is merely a nondisplaced superior pubic ramus fracture, the patient may be mobilized as symptoms permit, weightbearing as tolerated on the right lower extremity with a walker. If the CT scan demonstrates extension into the acetabulum, a period of nonweightbearing would be recommended before allowing the patient to begin to weight-bear on the right hip and lower extremity.  Thank you for asking me to participate in the care of this most pleasant woman. I will be happy to follow her with you.   Maryagnes Amos, MD  Beeper #:  [phone]  07/08/2017 12:04 AM

## 2017-07-09 DIAGNOSIS — M25559 Pain in unspecified hip: Secondary | ICD-10-CM | POA: Diagnosis present

## 2017-07-09 MED ORDER — SODIUM CHLORIDE 0.9 % IV SOLN
INTRAVENOUS | Status: DC
  Administered 2017-07-09 – 2017-07-10 (×2): via INTRAVENOUS

## 2017-07-09 NOTE — Progress Notes (Signed)
[facility] - [place] at [facility]   PATIENT NAME: Kristin Graham    MR#:  [id number]  DATE OF BIRTH:  08-23-1916  SUBJECTIVE:  CHIEF COMPLAINT:   Chief Complaint  Patient presents with  . Fall   No pain at rest. Patient has periods of confusion. No family at bedside today.  REVIEW OF SYSTEMS:    Review of Systems  Unable to perform ROS: Mental status change    DRUG ALLERGIES:   Allergies  Allergen Reactions  . Bee Venom Swelling    VITALS:  Blood pressure (!) 153/71, pulse (!) 101, temperature 98.6 F (37 C), temperature source Oral, resp. rate 19, height  (1.651 m), weight 90.7 kg (200 lb), SpO2 92 %.  PHYSICAL EXAMINATION:   Physical Exam  GENERAL:  83 y.o.-year-old patient lying in the bed with no acute distress.  EYES: Pupils equal, round, reactive to light and accommodation. No scleral icterus. Extraocular muscles intact.  HEENT: Head atraumatic, normocephalic. Oropharynx and nasopharynx clear.  NECK:  Supple, no jugular venous distention. No thyroid enlargement, no tenderness.  LUNGS: Normal breath sounds bilaterally, no wheezing, rales, rhonchi. No use of accessory muscles of respiration.  CARDIOVASCULAR: S1, S2 normal. No murmurs, rubs, or gallops.  ABDOMEN: Soft, nontender, nondistended. Bowel sounds present. No organomegaly or mass.  EXTREMITIES: No cyanosis, clubbing or edema b/l.    NEUROLOGIC: Cranial nerves II through XII are intact. No focal Motor or sensory deficits b/l.   PSYCHIATRIC: The patient is alert and Awake. Pleasantly confused. SKIN: No obvious rash, lesion, or ulcer.   LABORATORY PANEL:   CBC  Recent Labs Lab 07/08/17 0433  WBC 13.3*  HGB 13.9  HCT 41.4  PLT 164   ------------------------------------------------------------------------------------------------------------------ Chemistries   Recent Labs Lab 07/08/17 0433  NA 142  K 3.7  CL 100*  CO2 32  GLUCOSE 137*  BUN 15  CREATININE 0.56   CALCIUM 8.5*   ------------------------------------------------------------------------------------------------------------------  Cardiac Enzymes No results for input(s): TROPONINI in the last 168 hours. ------------------------------------------------------------------------------------------------------------------  RADIOLOGY:  Dg Chest 1 View  Result Date: 07/08/2017 CLINICAL DATA:  83 y/o F; fall with elevated white blood cell count. EXAM: CHEST 1 VIEW COMPARISON:  05/20/2017 chest radiograph FINDINGS: Stable mild cardiomegaly. Aortic atherosclerosis with calcification. Hyperinflated lungs with flattened diaphragms suggestive of COPD. No focal consolidation. Stable biapical nodular pleuroparenchymal scarring. No pleural effusion or pneumothorax. No acute osseous abnormality is evident. IMPRESSION: No active disease. Stable mild cardiomegaly. Aortic atherosclerosis. Stable findings suggesting COPD. Electronically Signed   By: Mitzi Hansen M.D.   On: 07/08/2017 01:20   Ct Head Wo Contrast  Result Date: 07/07/2017 CLINICAL DATA:  83 y/o  F; status post fall. EXAM: CT HEAD WITHOUT CONTRAST TECHNIQUE: Contiguous axial images were obtained from the base of the skull through the vertex without intravenous contrast. COMPARISON:  09/25/2013 CT of the head. FINDINGS: Brain: No evidence of acute infarction, hemorrhage, hydrocephalus, extra-axial collection or mass lesion/mass effect. Stable moderate chronic microvascular ischemic changes parenchymal volume loss of the brain for age. Vascular: Calcific atherosclerosis of carotid siphons. No hyperdense vessel. Skull: Normal. Negative for fracture or focal lesion. Sinuses/Orbits: Mild mucosal thickening of the maxillary sinuses with small mucous retention cysts is stable. The normal aeration of mastoid air cells. Bilateral intra-ocular lens replacement. Other: None. IMPRESSION: 1. No acute intracranial abnormality or calvarial fracture. 2. Stable  moderate for age chronic microvascular ischemic changes and moderate parenchymal volume loss of the brain. 3. Stable mild maxillary sinus disease.  Electronically Signed   By: Mitzi Hansen M.D.   On: 07/07/2017 22:51   Ct Pelvis Wo Contrast  Result Date: 07/08/2017 CLINICAL DATA:  83 year old with right hip pain after fall. EXAM: CT PELVIS WITHOUT CONTRAST TECHNIQUE: Multidetector CT imaging of the pelvis was performed following the standard protocol without intravenous contrast. COMPARISON:  Radiographs yesterday FINDINGS: Urinary Tract: Distal ureters are decompressed. The bladder is distended without wall thickening. Bowel: No bowel inflammation. Moderate stool in the included colon. Vascular/Lymphatic: Atherosclerosis.  No evidence pelvic adenopathy. Reproductive: Uterine calcification may be fibroid. No adnexal mass. Other:  No pelvic ascites. Musculoskeletal: Acute fracture of the right superior puboacetabular junction, essentially nondisplaced. No definite extension of the weight-bearing articular surface. Minimally displaced right inferior pubic ramus fracture. Bones are diffusely under mineralized. Moderate osteoarthritis of both hips, with bilateral hip joint effusions. Chronic enthesopathic change of both the ischial tuberosities. 5 cm fluid density structure adjacent to the anterior proximal femur measures simple fluid density, likely fluid distending the bursa, chronic and similar to prior PET 04/07/2010. IMPRESSION: 1. Right pelvic fractures, nondisplaced involving the right puboacetabular junction and minimally displaced involving the inferior right pubic ramus. 2. Diffuse bony under mineralization. 3. Bilateral hip osteoarthritis and joint effusions. Probable bursal formation adjacent the right proximal femur that is chronic and similar to remote prior PET. Electronically Signed   By: Rubye Oaks M.D.   On: 07/08/2017 00:44   Dg Hip Unilat  With Pelvis 2-3 Views Right  Result  Date: 07/07/2017 CLINICAL DATA:  83 y/o  F; status post fall with right hip pain. EXAM: DG HIP (WITH OR WITHOUT PELVIS) 2-3V RIGHT COMPARISON:  None. FINDINGS: The bones are demineralized. Cortical lucency within the right iliopectineal line. No proximal femur fracture identified. No hip dislocation. Moderate right hip osteoarthrosis with joint space narrowing. Symphysis pubis and sacroiliac joints are maintained. Vascular calcification. IMPRESSION: 1. Cortical lucency within right iliopectineal line may represent a lateral superior pubic ramus/anterior column of acetabulum fracture. 2. No proximal femur fracture identified. If clinically indicated, consider CT or MRI as radiographs can have low sensitivity in the elderly. Electronically Signed   By: Mitzi Hansen M.D.   On: 07/07/2017 22:59     ASSESSMENT AND PLAN:   . Pelvic fracture (HCC), right -Due to mechanical fall -Pain medications when necessary -Orthopedics has seen's patient, medical therapy recommended -PT consult  - We will need skilled nursing facility at discharge Orthopedics recommending Lovenox for 14 days.  Dyslipidemia -Continue patient statins  History of CHF/fluid overload -no active exacerbation in past. Continue by mouth Lasix  glaucoma  All the records are reviewed and case discussed with Care Management/Social Worker Management plans discussed with the patient, family and they are in agreement.  CODE STATUS: FULL CODE  DVT Prophylaxis: SCDs  TOTAL TIME TAKING CARE OF THIS PATIENT: 30 minutes.   POSSIBLE D/C IN 1-2 DAYS, DEPENDING ON CLINICAL CONDITION.  Milagros Loll R M.D on 07/09/2017 at 12:03 PM  Between 7am to 6pm - Pager - [phone]  After 6pm go to [email or web address] - password [email or web address] [facility]  [facility] [place] [facility]  Office  [phone]  CC: Primary care physician; Lynnea Ferrier, MD  Note: This dictation was prepared with Dragon dictation along with smaller phrase  technology. Any transcriptional errors that result from this process are unintentional.

## 2017-07-09 NOTE — Progress Notes (Signed)
Pt's daughter called the unit and has concerns that pt is dehydrated. Requests that she be "put on a drip" immediately. Informed her that I would inform pt's nurse of her concerns. Notified Marchelle Folks, RN who will speak with her. Pt's phone ringing, this Clinical research associate entered room and asked pt about answering the phone, pt stated "its my daughter, tell her I'll call her back later". Informed pt's daughter who became upset and stated she would be arriving to the hospital soon. Notified pt's nurse.

## 2017-07-09 NOTE — Progress Notes (Signed)
Pts. Daughter, legal health care POA, doesn't want pt. To have any morphine.

## 2017-07-09 NOTE — Progress Notes (Signed)
Physical Therapy Evaluation Patient Details Name: Kristin Graham MRN: [id number] DOB: 11-Sep-1916 Today's Date: 07/09/2017   History of Present Illness  Patient is a 83 y.o. female admitted on 29 SEP s/p mechanical fall with R pubic ramus fx. PMH includes dementia.  Clinical Impression  Patient is a pleasant female admitted for above listed reasons. Patient oriented to self at time of evaluation with no family at bedside. Demonstrates need for maximal to +2 assistance in all aspects of mobility. Did attempt sit to stand x3 with tendency to keep knees flexed and trunk flexed, unable to move into upright position with physical assistance and verbal cues. Patient will benefit from progressive PT to attempt bed to chair transfer at next session. Patient will require extensive PT to improve overall strength, balance, and functional mobility.     Follow Up Recommendations SNF    Equipment Recommendations  None recommended by PT    Recommendations for Other Services       Precautions / Restrictions Precautions Precautions: Fall Restrictions Weight Bearing Restrictions: Yes RLE Weight Bearing: Weight bearing as tolerated      Mobility  Bed Mobility Overal bed mobility: Needs Assistance Bed Mobility: Supine to Sit;Sit to Supine     Supine to sit: Max assist;HOB elevated Sit to supine: +2 for physical assistance   General bed mobility comments: Patient requires maximal assistance to move from supine to sit with verbal cues for sequencing. Required +2 assistance to return to supine.  Transfers Overall transfer level: Needs assistance Equipment used: Rolling walker (2 wheeled) Transfers: Sit to/from Stand Sit to Stand: Max assist         General transfer comment: Sit to stand transfer attempted x3 with patient tending to keep knees flexed and trunk flexed. Required maximal assistance to maintain balance and attempt upright posture.  Ambulation/Gait                Stairs            Wheelchair Mobility    Modified Rankin (Stroke Patients Only)       Balance Overall balance assessment: Needs assistance;History of Falls Sitting-balance support: Feet supported Sitting balance-Leahy Scale: Fair     Standing balance support: Bilateral upper extremity supported Standing balance-Leahy Scale: Poor                               Pertinent Vitals/Pain Pain Assessment: Faces Faces Pain Scale: Hurts even more Pain Location: R hip Pain Descriptors / Indicators: Aching Pain Intervention(s): Limited activity within patient's tolerance;Monitored during session;Repositioned    Home Living Family/patient expects to be discharged to:: Unsure Living Arrangements: Alone                    Prior Function Level of Independence: Needs assistance         Comments: PLOF unknown due to confusion with no family at bedside.     Hand Dominance        Extremity/Trunk Assessment   Upper Extremity Assessment Upper Extremity Assessment: Generalized weakness    Lower Extremity Assessment Lower Extremity Assessment: Generalized weakness;RLE deficits/detail RLE Deficits / Details: Decreased A/PROM with 3-/5 strength RLE: Unable to fully assess due to pain       Communication   Communication: No difficulties  Cognition Arousal/Alertness: Awake/alert Behavior During Therapy: WFL for tasks assessed/performed Overall Cognitive Status: History of cognitive impairments - at baseline  General Comments: Patient oriented x1      General Comments      Exercises     Assessment/Plan    PT Assessment Patient needs continued PT services  PT Problem List Decreased strength;Decreased range of motion;Decreased activity tolerance;Decreased balance;Decreased mobility;Decreased cognition;Decreased knowledge of use of DME;Decreased safety awareness;Pain       PT Treatment Interventions DME  instruction;Gait training;Stair training;Functional mobility training;Therapeutic activities;Balance training;Therapeutic exercise;Cognitive remediation;Patient/family education    PT Goals (Current goals can be found in the Care Plan section)  Acute Rehab PT Goals Patient Stated Goal: Unstated PT Goal Formulation: With patient Time For Goal Achievement: 07/23/17 Potential to Achieve Goals: Fair    Frequency BID   Barriers to discharge Inaccessible home environment;Decreased caregiver support      Co-evaluation               AM-PAC PT "6 Clicks" Daily Activity  Outcome Measure Difficulty turning over in bed (including adjusting bedclothes, sheets and blankets)?: Unable Difficulty moving from lying on back to sitting on the side of the bed? : Unable Difficulty sitting down on and standing up from a chair with arms (e.g., wheelchair, bedside commode, etc,.)?: Unable Help needed moving to and from a bed to chair (including a wheelchair)?: Total Help needed walking in hospital room?: Total Help needed climbing 3-5 steps with a railing? : Total 6 Click Score: 6    End of Session Equipment Utilized During Treatment: Gait belt Activity Tolerance: Patient limited by pain;Patient tolerated treatment well Patient left: in bed;with call bell/phone within reach;with bed alarm set;with SCD's reapplied   PT Visit Diagnosis: Muscle weakness (generalized) (M62.81);History of falling (Z91.81);Difficulty in walking, not elsewhere classified (R26.2);Pain Pain - Right/Left: Right Pain - part of body: Hip    Time: 1610-9604 PT Time Calculation (min) (ACUTE ONLY): 45 min   Charges:   PT Evaluation $PT Eval Low Complexity: 1 Low     PT G Codes:   PT G-Codes **NOT FOR INPATIENT CLASS** Functional Assessment Tool Used: AM-PAC 6 Clicks Basic Mobility;Clinical judgement Functional Limitation: Mobility: Walking and moving around Mobility: Walking and Moving Around Current Status (V4098):  100 percent impaired, limited or restricted Mobility: Walking and Moving Around Discharge Status (J1914): At least 80 percent but less than 100 percent impaired, limited or restricted      Neita Carp, PT, DPT 07/09/2017, 1:51 PM

## 2017-07-09 NOTE — NC FL2 (Signed)
[place] MEDICAID FL2 LEVEL OF CARE SCREENING TOOL     IDENTIFICATION  Patient Name: Kristin Graham Birthdate: [birth year] Sex: female Admission Date (Current Location): 07/07/2017  [facility] and [place] Number:      Facility and Address:         Provider Number:    Attending Physician Name and Address:  Milagros Loll, MD  Relative Name and Phone Number:  Terren Haberle (daughter) [phone]    Current Level of Care: Hospital Recommended Level of Care: Skilled Nursing Facility Prior Approval Number:    Date Approved/Denied: 08/10/05 PASRR Number: [id number]  Discharge Plan: SNF    Current Diagnoses: Patient Active Problem List   Diagnosis Date Noted  . Pelvic fracture (HCC) 07/08/2017  . Cataract 07/08/2017  . CHF (congestive heart failure) (HCC) 07/08/2017  . Dyslipidemia 07/08/2017  . TIA (transient ischemic attack) 07/08/2017    Orientation RESPIRATION BLADDER Height & Weight     Self, Situation, Time  Normal Incontinent Weight: 200 lb (90.7 kg) Height:   (165.1 cm)  BEHAVIORAL SYMPTOMS/MOOD NEUROLOGICAL BOWEL NUTRITION STATUS      Incontinent Diet (Low Sodium-Heart Healthy)  AMBULATORY STATUS COMMUNICATION OF NEEDS Skin   Extensive Assist Verbally Bruising                       Personal Care Assistance Level of Assistance  Bathing, Feeding, Dressing Bathing Assistance: Maximum assistance Feeding assistance: Limited assistance Dressing Assistance: Maximum assistance     Functional Limitations Info             SPECIAL CARE FACTORS FREQUENCY  PT (By licensed PT)     PT Frequency: Up to 5X per day, 5 days per week              Contractures Contractures Info: Not present    Additional Factors Info  Code Status, Allergies Code Status Info: Full Allergies Info: Bee Venom           Current Medications (07/09/2017):  This is the current hospital active medication list Current Facility-Administered Medications   Medication Dose Route Frequency Provider Last Rate Last Dose  . acetaminophen (TYLENOL) tablet 650 mg  650 mg Oral Q6H PRN Gery Pray, MD   650 mg at 07/09/17 1147   Or  . acetaminophen (TYLENOL) suppository 650 mg  650 mg Rectal Q6H PRN Crosley, Debby, MD      . aspirin tablet 325 mg  325 mg Oral Daily Crosley, Debby, MD   325 mg at 07/09/17 1030  . atorvastatin (LIPITOR) tablet 10 mg  10 mg Oral Daily Crosley, Debby, MD   10 mg at 07/08/17 1730  . B-complex with vitamin C tablet 1 tablet  1 tablet Oral Daily Sudini, Wardell Heath, MD   1 tablet at 07/09/17 0805  . dorzolamide-timolol (COSOPT) 22.3-6.8 MG/ML ophthalmic solution 1 drop  1 drop Both Eyes BID Gery Pray, MD   1 drop at 07/09/17 0806  . enoxaparin (LOVENOX) injection 40 mg  40 mg Subcutaneous Q24H Crosley, Debby, MD   40 mg at 07/09/17 0805  . furosemide (LASIX) tablet 30 mg  30 mg Oral Q T,W,Th,S,Su-1800 Crosley, Debby, MD   30 mg at 07/08/17 1730   And  . [START ON 07/10/2017] furosemide (LASIX) tablet 40 mg  40 mg Oral Q Mon-1800 Crosley, Debby, MD      . Melene Muller ON 07/14/2017] furosemide (LASIX) tablet 40 mg  40 mg Oral Q Fri-1800 Gery Pray, MD      .  latanoprost (XALATAN) 0.005 % ophthalmic solution 1 drop  1 drop Both Eyes QHS Crosley, Debby, MD   1 drop at 07/08/17 2100  . morphine 2 MG/ML injection 0.5 mg  0.5 mg Intravenous Q4H PRN Crosley, Debby, MD      . ondansetron (ZOFRAN) tablet 4 mg  4 mg Oral Q6H PRN Crosley, Debby, MD       Or  . ondansetron (ZOFRAN) injection 4 mg  4 mg Intravenous Q6H PRN Crosley, Debby, MD      . polyethylene glycol (MIRALAX / GLYCOLAX) packet 17 g  17 g Oral Daily PRN Crosley, Debby, MD      . traMADol (ULTRAM) tablet 25 mg  25 mg Oral QHS Crosley, Debby, MD   25 mg at 07/08/17 2059  . vitamin B-12 (CYANOCOBALAMIN) tablet 100 mcg  100 mcg Oral Daily Milagros Loll, MD   100 mcg at 07/09/17 0805  . vitamin E capsule 400 Units  400 Units Oral Daily Carolynne Edouard, RPH   400 Units at  07/09/17 0805  . zinc sulfate capsule 220 mg  220 mg Oral Daily Milagros Loll, MD   220 mg at 07/09/17 0805     Discharge Medications: Please see discharge summary for a list of discharge medications.  Relevant Imaging Results:  Relevant Lab Results:   Additional Information SS#[id number]  Judi Cong, LCSW

## 2017-07-09 NOTE — Progress Notes (Signed)
Advance care planning  Patient with dementia and inpatient delirium. Discussed with her daughter Kristin Graham who is the healthcare power of attorney . Patient has been cared for by daughter for 17 years now. She tells me she left her job 4 years back to care for her mom. Does have mild dementia which is worse in the hospital at this time. She is hoping her mom can go to skilled nursing facility from the hospital and transitioned back home. Daughter does not like her mom on many medications. She is a Scientist, research (medical). We discussed regarding her dementia, congestive heart failure. Daughter was concerned about patient getting Lovenox in the hospital and we discussed regarding the side effects and the necessity to prevent DVT. Daughter agrees with Lovenox at this time.  We discussed regarding code status and she wants her mom to be a FULL CODE. I explained intubation and CPR. Daughter worked for an Pensions consultant and is a IT consultant. She tells me she understands regarding CODE STATUS well and wants her mom to be full code. Although this decision was made 17 years back she will discuss with her brother and make changes if needed.  We will place a palliative care consult.  Time spent 20 minutes

## 2017-07-09 NOTE — Clinical Social Work Note (Signed)
CSW received verbal consult from [facility] that PT is recommending STR. The patient has [employer] and will need to give prior authorization for STR. CSW attempted to contact the patient's daughter with no success. CSW saw in chart review that the patient's daughter has stated that she wants STR at a SNF. CSW has begun process and will follow up with the patient's daughter when she is next available.  Argentina Ponder, MSW, Theresia Majors [phone]

## 2017-07-09 NOTE — Care Management CC44 (Signed)
Condition Code 44 Documentation Completed  Patient Details  Name: DESHONDRA WORST MRN: [id number] Date of Birth: [birth year]   Condition Code 44 given:  Yes Patient signature on Condition Code 44 notice:  Yes Documentation of 2 MD's agreement:  Yes Code 44 added to claim:  Yes    Jordy Hewins A, RN 07/09/2017, 3:36 PM

## 2017-07-09 NOTE — Care Management Obs Status (Signed)
MEDICARE OBSERVATION STATUS NOTIFICATION   Patient Details  Name: Kristin Graham MRN: [id number] Date of Birth: [birth year]   Medicare Observation Status Notification Given:  Yes    Marquasia Schmieder A, RN 07/09/2017, 3:36 PM

## 2017-07-09 NOTE — Progress Notes (Signed)
Subjective :Patient is day 2 admission for right superior pubis ramus fracture. Patient reports pain as having no pain to the right hip and that she moves the leg..   no nausea and no vomiting Patient still very confused today. Patient complaining of the compression wraps being very hot.   Objective: Vital signs in last 24 hours: Temp:  [98.1 F (36.7 C)-98.6 F (37 C)] 98.6 F (37 C) (09/30 0707) Pulse Rate:  [82-101] 101 (09/30 0707) Resp:  [16-20] 19 (09/30 0707) BP: (131-153)/(68-72) 153/71 (09/30 0707) SpO2:  [91 %-92 %] 92 % (09/30 0707) Still has significant swelling to the feet with 2+ pitting edema noted. Patient able to do dorsiflexion and plantarflexion of the feet with no problems.  Intake/Output from previous day: 09/29 0701 - 09/30 0700 In: 120 [P.O.:120] Out: 1600 [Urine:1600] Intake/Output this shift: No intake/output data recorded.   Recent Labs  07/07/17 2310 07/08/17 0433  HGB 14.6 13.9    Recent Labs  07/07/17 2310 07/08/17 0433  WBC 20.1* 13.3*  RBC 4.87 4.54  HCT 43.4 41.4  PLT 170 164    Recent Labs  07/07/17 2310 07/08/17 0433  NA 140 142  K 4.5 3.7  CL 102 100*  CO2 26 32  BUN 14 15  CREATININE 0.59 0.56  GLUCOSE 116* 137*  CALCIUM 8.8* 8.5*   No results for input(s): LABPT, INR in the last 72 hours.  Neurologically intact Neurovascular intact Sensation intact distally Intact pulses distally Dorsiflexion/Plantar flexion intact  Assessment/Plan: Right pubic ramus fracture. Case management to assist with discharge planning Physical therapy today Bowel movement today Labs in am Plan to discharge when medically cleared. Would recommend continue Lovenox 40 mg daily for 14 days at discharge. Follow-up [facility] orthopedics with Dr. Joice Lofts or [facility] in 4 weeks. Continue compression wraps while in rehabilitation  WOLFE,JON R. 07/09/2017, 8:22 AM

## 2017-07-09 NOTE — Progress Notes (Signed)
Patient's daughter called with high concerns that patient is confused and disoriented, and may be dehydrated, daughter would like patient to be on IV fluids. Dr Elpidio Anis paged. Waiting on callback.

## 2017-07-10 ENCOUNTER — Encounter
Admission: RE | Admit: 2017-07-10 | Discharge: 2017-07-10 | Disposition: A | Discharge status: Home / Self Care | Payer: Medicare Other | Source: Ambulatory Visit | Attending: Internal Medicine | Admitting: Internal Medicine

## 2017-07-10 MED ORDER — ENOXAPARIN SODIUM 40 MG/0.4ML ~~LOC~~ SOLN: 40.0000 mg | SUBCUTANEOUS | 0 refills | Status: DC

## 2017-07-10 MED ORDER — BISACODYL 10 MG RE SUPP
10.0000 mg | Freq: Once | RECTAL | Status: AC
  Administered 2017-07-10: 10 mg via RECTAL

## 2017-07-10 NOTE — [place] FL2 (Signed)
[place] MEDICAID FL2 LEVEL OF CARE SCREENING TOOL     IDENTIFICATION  Patient Name: Kristin Graham Birthdate: [birth year] Sex: female Admission Date (Current Location): 07/07/2017  [facility] and [place] Number:      Facility and Address:         Provider Number:    Attending Physician Name and Address:  Enedina Finner, MD  Relative Name and Phone Number:  Drenda Sobecki (daughter) [phone]    Current Level of Care: Hospital Recommended Level of Care: Skilled Nursing Facility Prior Approval Number:    Date Approved/Denied: 08/10/05 PASRR Number: [id number]  Discharge Plan: SNF    Current Diagnoses: Patient Active Problem List   Diagnosis Date Noted  . Hip pain 07/09/2017  . Pelvic fracture (HCC) 07/08/2017  . Cataract 07/08/2017  . CHF (congestive heart failure) (HCC) 07/08/2017  . Dyslipidemia 07/08/2017  . TIA (transient ischemic attack) 07/08/2017    Orientation RESPIRATION BLADDER Height & Weight     Self, Situation, Time  Normal Incontinent Weight: 200 lb (90.7 kg) Height:   (165.1 cm)  BEHAVIORAL SYMPTOMS/MOOD NEUROLOGICAL BOWEL NUTRITION STATUS      Incontinent Diet (Low Sodium-Heart Healthy)  AMBULATORY STATUS COMMUNICATION OF NEEDS Skin   Extensive Assist Verbally Bruising                       Personal Care Assistance Level of Assistance  Bathing, Feeding, Dressing Bathing Assistance: Maximum assistance Feeding assistance: Limited assistance Dressing Assistance: Maximum assistance     Functional Limitations Info             SPECIAL CARE FACTORS FREQUENCY  PT (By licensed PT)     PT Frequency: Up to 5X per day, 5 days per week              Contractures Contractures Info: Not present    Additional Factors Info  Code Status, Allergies Code Status Info: Full Allergies Info: Bee Venom           Current Medications (07/10/2017):  This is the current hospital active medication list Current Facility-Administered  Medications  Medication Dose Route Frequency Provider Last Rate Last Dose  . 0.9 %  sodium chloride infusion   Intravenous Continuous Milagros Loll, MD 50 mL/hr at 07/09/17 1757    . acetaminophen (TYLENOL) tablet 650 mg  650 mg Oral Q6H PRN Gery Pray, MD   650 mg at 07/09/17 1147   Or  . acetaminophen (TYLENOL) suppository 650 mg  650 mg Rectal Q6H PRN Crosley, Debby, MD      . aspirin tablet 325 mg  325 mg Oral Daily Crosley, Debby, MD   325 mg at 07/09/17 1030  . atorvastatin (LIPITOR) tablet 10 mg  10 mg Oral Daily Crosley, Debby, MD   10 mg at 07/09/17 1627  . B-complex with vitamin C tablet 1 tablet  1 tablet Oral Daily Milagros Loll, MD   1 tablet at 07/10/17 1039  . dorzolamide-timolol (COSOPT) 22.3-6.8 MG/ML ophthalmic solution 1 drop  1 drop Both Eyes BID Gery Pray, MD   1 drop at 07/10/17 1039  . enoxaparin (LOVENOX) injection 40 mg  40 mg Subcutaneous Q24H Crosley, Debby, MD   40 mg at 07/10/17 0807  . furosemide (LASIX) tablet 30 mg  30 mg Oral Q T,W,Th,S,Su-1800 Crosley, Debby, MD   30 mg at 07/09/17 1628   And  . furosemide (LASIX) tablet 40 mg  40 mg Oral Q Mon-1800 Gery Pray, MD      . [  START ON 07/14/2017] furosemide (LASIX) tablet 40 mg  40 mg Oral Q Fri-1800 Crosley, Debby, MD      . latanoprost (XALATAN) 0.005 % ophthalmic solution 1 drop  1 drop Both Eyes QHS Crosley, Debby, MD   1 drop at 07/09/17 2021  . ondansetron (ZOFRAN) tablet 4 mg  4 mg Oral Q6H PRN Crosley, Debby, MD       Or  . ondansetron (ZOFRAN) injection 4 mg  4 mg Intravenous Q6H PRN Crosley, Debby, MD      . polyethylene glycol (MIRALAX / GLYCOLAX) packet 17 g  17 g Oral Daily PRN Crosley, Debby, MD   17 g at 07/10/17 1040  . traMADol (ULTRAM) tablet 25 mg  25 mg Oral QHS Crosley, Debby, MD   25 mg at 07/08/17 2059  . vitamin B-12 (CYANOCOBALAMIN) tablet 100 mcg  100 mcg Oral Daily Milagros Loll, MD   100 mcg at 07/10/17 1039  . vitamin E capsule 400 Units  400 Units Oral Daily Carolynne Edouard, RPH   400 Units at 07/10/17 1039  . zinc sulfate capsule 220 mg  220 mg Oral Daily Milagros Loll, MD   220 mg at 07/10/17 1039     Discharge Medications: Please see discharge summary for a list of discharge medications.  Relevant Imaging Results:  Relevant Lab Results:   Additional Information SS#[id number]  Parnell Spieler, Darleen Crocker, [place]

## 2017-07-10 NOTE — Progress Notes (Addendum)
[facility] [place] at [facility]   PATIENT NAME: Shawndell Varas    MR#:  [id number]  DATE OF BIRTH:  12/21/1915  SUBJECTIVE:  Denies any complaints. Waiting for coffee and juice  REVIEW OF SYSTEMS:   Review of Systems  Constitutional: Negative for chills, fever and weight loss.  HENT: Negative for ear discharge, ear pain and nosebleeds.   Eyes: Negative for blurred vision, pain and discharge.  Respiratory: Negative for sputum production, shortness of breath, wheezing and stridor.   Cardiovascular: Negative for chest pain, palpitations, orthopnea and PND.  Gastrointestinal: Negative for abdominal pain, diarrhea, nausea and vomiting.  Genitourinary: Negative for frequency and urgency.  Musculoskeletal: Negative for back pain and joint pain.  Neurological: Positive for weakness. Negative for sensory change, speech change and focal weakness.  Psychiatric/Behavioral: Negative for depression and hallucinations. The patient is not nervous/anxious.    Tolerating Diet:yes Tolerating PT:  rehab  DRUG ALLERGIES:   Allergies  Allergen Reactions  . Bee Venom Swelling    VITALS:  Blood pressure 135/65, pulse 99, temperature 98.6 F (37 C), temperature source Oral, resp. rate 20, height  (1.651 m), weight 90.7 kg (200 lb), SpO2 92 %.  PHYSICAL EXAMINATION:   Physical Exam  GENERAL:  83 y.o.-year-old patient lying in the bed with no acute distress.  EYES: Pupils equal, round, reactive to light and accommodation. No scleral icterus. Extraocular muscles intact.  HEENT: Head atraumatic, normocephalic. Oropharynx and nasopharynx clear.  NECK:  Supple, no jugular venous distention. No thyroid enlargement, no tenderness.  LUNGS: Normal breath sounds bilaterally, no wheezing, rales, rhonchi. No use of accessory muscles of respiration.  CARDIOVASCULAR: S1, S2 normal. No murmurs, rubs, or gallops.  ABDOMEN: Soft, nontender, nondistended. Bowel sounds present. No  organomegaly or mass.  EXTREMITIES: No cyanosis, clubbing or edema b/l.    NEUROLOGIC: Cranial nerves II through XII are intact. No focal Motor or sensory deficits b/l.   PSYCHIATRIC:  patient is alert. Some baseline dementia SKIN: No obvious rash, lesion, or ulcer.   LABORATORY PANEL:  CBC  Recent Labs Lab 07/08/17 0433  WBC 13.3*  HGB 13.9  HCT 41.4  PLT 164    Chemistries   Recent Labs Lab 07/08/17 0433  NA 142  K 3.7  CL 100*  CO2 32  GLUCOSE 137*  BUN 15  CREATININE 0.56  CALCIUM 8.5*   Cardiac Enzymes No results for input(s): TROPONINI in the last 168 hours. RADIOLOGY:  No results found. ASSESSMENT AND PLAN:  83 year old female who lives at home, today showed a mechanical fall while getting up from the kitchen table. She did hit her head. Her daughter was present and she was brought to the ER.  [name] Pelvic fracture (HCC), right -Due to mechanical fall -Pain medications when necessary -Orthopedics has seen's patient, medical therapy recommended -PT consult noted---recommends rehab. CSW for d/c planning - We will need skilled nursing facility at discharge Orthopedics recommending Lovenox for 14 days.  Dyslipidemia -Continue patient statins  History of CHF/fluid overload -no active exacerbation in past. Continue by mouth Lasix  Glaucoma Cont  Home eye drops  H/o Dementia  -pleasantly confused. answered most basic questions appropriately.  D/c to rehab once bed available  Case discussed with Care Management/Social Worker. Management plans discussed with the patient, family and they are in agreement.  CODE STATUS: FULL  DVT Prophylaxis: Lovenox  TOTAL TIME TAKING CARE OF THIS PATIENT: *30* minutes.  >50% time spent on counselling and coordination of  care  D/C pending bed availability  Note: This dictation was prepared with Dragon dictation along with smaller phrase technology. Any transcriptional errors that result from this process are  unintentional.  Jahmire Ruffins M.D on 07/10/2017 at 8:15 AM  Between 7am to 6pm - Pager - [phone]  After 6pm go to [email or web address] - password [email or web address]  [facility]  Office  [phone]  CC: Primary care physician; Lynnea Ferrier, MDPatient ID: Ruthann Cancer, female   DOB: 1915-11-06, 83 y.o.   MRN: [id number]

## 2017-07-10 NOTE — Discharge Summary (Signed)
[facility] [place] at [facility]   PATIENT NAME: Kristin Graham    MR#:  [id number]  DATE OF BIRTH:  May 20, 1916  DATE OF ADMISSION:  07/07/2017 ADMITTING PHYSICIAN: Gery Pray, MD  DATE OF DISCHARGE: 07/10/17  PRIMARY CARE PHYSICIAN: Lynnea Ferrier, MD    ADMISSION DIAGNOSIS:  Fall [W19.XXXA] Multiple closed fractures of pelvis without disruption of pelvic ring, initial encounter (HCC) [S32.82XA] Pelvic joint pain, right [M25.551] Leukocytosis, unspecified type [D72.829]  DISCHARGE DIAGNOSIS:  Acute Right Pelvic fracture   SECONDARY DIAGNOSIS:  History reviewed. No pertinent past medical history.  HOSPITAL COURSE:   83 year old female who lives at home, today showed a mechanical fall while getting up from the kitchen table. She did hit her head. Her daughter was present and she was brought to the ER.  Marland KitchenPelvic fracture (HCC), right -Due to mechanical fall -Pain medications when necessary -Orthopedics has seen's patient, medical therapy recommended -PT consult noted---recommends rehab. CSW for d/c planning - We will need skilled nursing facility at discharge Orthopedics recommending Lovenox for 14 days.  Dyslipidemia -Continue patient statins  History of CHF/fluid overload -no active exacerbation in past. Continue by mouth Lasix  Glaucoma Cont  Home eye drops  H/o Dementia  -pleasantly confused. answered most basic questions appropriately.  D/c to rehab today CONSULTS OBTAINED:  Treatment Team:  Christena Flake, MD  DRUG ALLERGIES:   Allergies  Allergen Reactions  . Bee Venom Swelling    DISCHARGE MEDICATIONS:   Current Discharge Medication List    START taking these medications   Details  enoxaparin (LOVENOX) 40 MG/0.4ML injection Inject 0.4 mLs (40 mg total) into the skin daily. Qty: 14 Syringe, Refills: 0      CONTINUE these medications which have NOT CHANGED   Details  acetaminophen (TYLENOL) 500 MG  tablet Take 250 mg by mouth at bedtime.    aspirin 325 MG tablet Take 325 mg by mouth daily.    atorvastatin (LIPITOR) 10 MG tablet Take 10 mg by mouth daily.    B Complex Vitamins (VITAMIN-B COMPLEX) TABS Take 1 tablet by mouth daily.    bimatoprost (LUMIGAN) 0.03 % ophthalmic solution Place 1 drop into both eyes at bedtime.    camphor-menthol (SARNA) lotion Apply topically.    dorzolamide-timolol (COSOPT) 22.3-6.8 MG/ML ophthalmic solution Place 1 drop into both eyes 2 (two) times daily.    furosemide (LASIX) 40 MG tablet Take 30 mg by mouth daily. Weekly  x5 days,  x2 days    Garlic 1000 MG CAPS Take by mouth.    Ginkgo Biloba 40 MG TABS Take by mouth.    Methylsulfonylmethane (MSM) 500 MG CAPS Take by mouth.    traMADol (ULTRAM) 50 MG tablet Take 12.5 mg by mouth at bedtime. Reported by daughter: "1/4 of  tablet daily at bedtime"    vitamin B-12 (CYANOCOBALAMIN) 100 MCG tablet Take 100 mcg by mouth daily.    Vitamin E 400 units TABS Take 1 tablet by mouth daily.    Zinc Sulfate (ZINC-220 PO) Take 1 tablet by mouth daily.        If you experience worsening of your admission symptoms, develop shortness of breath, life threatening emergency, suicidal or homicidal thoughts you must seek medical attention immediately by calling 911 or calling your MD immediately  if symptoms less severe.  You Must read complete instructions/literature along with all the possible adverse reactions/side effects for all the Medicines you take and that have been prescribed to you.  Take any new Medicines after you have completely understood and accept all the possible adverse reactions/side effects.   Please note  You were cared for by a hospitalist during your hospital stay. If you have any questions about your discharge medications or the care you received while you were in the hospital after you are discharged, you can call the unit and asked to speak with the hospitalist on call if  the hospitalist that took care of you is not available. Once you are discharged, your primary care physician will handle any further medical issues. Please note that NO REFILLS for any discharge medications will be authorized once you are discharged, as it is imperative that you return to your primary care physician (or establish a relationship with a primary care physician if you do not have one) for your aftercare needs so that they can reassess your need for medications and monitor your lab values. Today   SUBJECTIVE   Doing ok. Answered basic questions well  VITAL SIGNS:  Blood pressure 135/65, pulse 99, temperature 98.6 F (37 C), temperature source Oral, resp. rate 20, height  (1.651 m), weight 90.7 kg (200 lb), SpO2 92 %.  I/O:   Intake/Output Summary (Last 24 hours) at 07/10/17 1140 Last data filed at 07/10/17 0612  Gross per 24 hour  Intake           765.84 ml  Output             1075 ml  Net          -309.16 ml    PHYSICAL EXAMINATION:  GENERAL:  83 y.o.-year-old patient lying in the bed with no acute distress.  EYES: Pupils equal, round, reactive to light and accommodation. No scleral icterus. Extraocular muscles intact.  HEENT: Head atraumatic, normocephalic. Oropharynx and nasopharynx clear.  NECK:  Supple, no jugular venous distention. No thyroid enlargement, no tenderness.  LUNGS: Normal breath sounds bilaterally, no wheezing, rales,rhonchi or crepitation. No use of accessory muscles of respiration.  CARDIOVASCULAR: S1, S2 normal. No murmurs, rubs, or gallops.  ABDOMEN: Soft, non-tender, non-distended. Bowel sounds present. No organomegaly or mass.  EXTREMITIES: No pedal edema, cyanosis, or clubbing.  NEUROLOGIC: Cranial nerves II through XII are intact. Muscle strength 5/5 in all extremities. Sensation intact. Gait not checked.  PSYCHIATRIC: The patient is alert and oriented x 3.  SKIN: No obvious rash, lesion, or ulcer.   DATA REVIEW:   CBC   Recent  Labs Lab 07/08/17 0433  WBC 13.3*  HGB 13.9  HCT 41.4  PLT 164    Chemistries   Recent Labs Lab 07/08/17 0433  NA 142  K 3.7  CL 100*  CO2 32  GLUCOSE 137*  BUN 15  CREATININE 0.56  CALCIUM 8.5*    Microbiology Results   No results found for this or any previous visit (from the past 240 hour(s)).  RADIOLOGY:  No results found.   Management plans discussed with the patient, family and they are in agreement.  CODE STATUS:     Code Status Orders        Start     Ordered   07/08/17 0353  Full code  Continuous     07/08/17 0352    Code Status History    Date Active Date Inactive Code Status Order ID Comments User Context   This patient has a current code status but no historical code status.    Advance Directive Documentation     Most Recent Value  Type of Advance Directive  Healthcare Power of Attorney  Pre-existing out of facility DNR order (yellow form or pink MOST form)  -  "MOST" Form in Place?  -      TOTAL TIME TAKING CARE OF THIS PATIENT: *40* minutes.    Bandon Sherwin M.D on 07/10/2017 at 11:40 AM  Between 7am to 6pm - Pager - [phone] After 6pm go to [email or web address] - password [email or web address]  [facility]  Office  [phone]  CC: Primary care physician; Lynnea Ferrier, MD

## 2017-07-10 NOTE — Progress Notes (Signed)
Patient O2 sat 89% on room air. 2 L of O2 given via nasal canula. O2 94 % now. MD SPatel paged.  Stephannie Peters, RN

## 2017-07-10 NOTE — Progress Notes (Signed)
Physical Therapy Treatment Patient Details Name: Kristin Graham MRN: [id number] DOB: 09-Feb-1916 Today's Date: 07/10/2017    History of Present Illness Patient is a 83 y.o. female admitted on 29 SEP s/p mechanical fall with R pubic ramus fx. PMH includes dementia.    PT Comments    Pt lethargic, but awoken somewhat through voice and touch. Pt responds to questions with increased time, but also offers communication that demonstrates confusion; oriented to self only. Pt able to demonstrate minimal movement with increased time to process request in Bilateral lower extremities; however, yells out in pain with touch most of the time to either lower extremity. Pt states she needs to have a bowel movement. Nursing contacted for assist. Pt confused between bowel movement and voiding, but is unable to do either at this time. Pt requires Max A for bed mobility for rolling and also for whole lower extremity movements. Pt to be discharged to skilled nursing facility today to continue rehab efforts.    Follow Up Recommendations  SNF     Equipment Recommendations  None recommended by PT    Recommendations for Other Services       Precautions / Restrictions Precautions Precautions: Fall Restrictions Weight Bearing Restrictions: Yes RLE Weight Bearing: Weight bearing as tolerated    Mobility  Bed Mobility Overal bed mobility: Needs Assistance Bed Mobility: Rolling Rolling: Max assist;+2 for physical assistance            Transfers                    Ambulation/Gait                 Stairs            Wheelchair Mobility    Modified Rankin (Stroke Patients Only)       Balance                                            Cognition Arousal/Alertness: Lethargic;Suspect due to medications Behavior During Therapy: Flat affect (except for pain response) Overall Cognitive Status: History of cognitive impairments - at baseline                                  General Comments: Oriented to self only      Exercises General Exercises - Lower Extremity Ankle Circles/Pumps: AROM;Both;10 reps;Supine Quad Sets: Strengthening;Both;5 reps;Supine;10 reps (inconsistently) Short Arc Quad:  (attempted, unable to position) Heel Slides: PROM;Both;10 reps;Supine Hip ABduction/ADduction: PROM;Both;10 reps;Supine    General Comments        Pertinent Vitals/Pain Pain Assessment: Faces Faces Pain Scale: Hurts whole lot Pain Location: BLEs (with touch/movement) Pain Intervention(s): Limited activity within patient's tolerance;Monitored during session;Repositioned;Other (comment) (contacted nursing)    Home Living                      Prior Function            PT Goals (current goals can now be found in the care plan section) Progress towards PT goals: Not progressing toward goals - comment    Frequency    BID      PT Plan Current plan remains appropriate    Co-evaluation              AM-PAC PT "  6 Clicks" Daily Activity  Outcome Measure  Difficulty turning over in bed (including adjusting bedclothes, sheets and blankets)?: Unable Difficulty moving from lying on back to sitting on the side of the bed? : Unable Difficulty sitting down on and standing up from a chair with arms (e.g., wheelchair, bedside commode, etc,.)?: Unable Help needed moving to and from a bed to chair (including a wheelchair)?: Total Help needed walking in hospital room?: Total Help needed climbing 3-5 steps with a railing? : Total 6 Click Score: 6    End of Session Equipment Utilized During Treatment: Other (comment) (Nursing placing patient on O2) Activity Tolerance: Patient limited by lethargy;Patient limited by pain Patient left: in bed;with call bell/phone within reach;with bed alarm set;with nursing/sitter in room Nurse Communication: Other (comment) (O2 saturation, level of lethargy, pt request bathroom) PT Visit  Diagnosis: Muscle weakness (generalized) (M62.81);History of falling (Z91.81);Difficulty in walking, not elsewhere classified (R26.2);Pain Pain - Right/Left: Right Pain - part of body: Hip     Time: 1610-9604 PT Time Calculation (min) (ACUTE ONLY): 26 min  Charges:  $Therapeutic Exercise: 8-22 mins $Therapeutic Activity: 8-22 mins                    G Codes:  Functional Assessment Tool Used: AM-PAC 6 Clicks Basic Mobility;Clinical judgement     Scot Dock, PTA 07/10/2017, 1:10 PM

## 2017-07-10 NOTE — Clinical Social Work Placement (Signed)
   CLINICAL SOCIAL WORK PLACEMENT  NOTE  Date:  07/10/2017  Patient Details  Name: Kristin Graham MRN: [id number] Date of Birth: [birth year]  Clinical Social Work is seeking post-discharge placement for this patient at the Skilled  Nursing Facility level of care (*CSW will initial, date and re-position this form in  chart as items are completed):  Yes   Patient/family provided with [facility] Clinical Social Work Department's list of facilities offering this level of care within the geographic area requested by the patient (or if unable, by the patient's family).  Yes   Patient/family informed of their freedom to choose among providers that offer the needed level of care, that participate in Medicare, Medicaid or managed care program needed by the patient, have an available bed and are willing to accept the patient.  Yes   Patient/family informed of [facility]'s ownership interest in [facility] and [facility], as well as of the fact that they are under no obligation to receive care at these facilities.  PASRR submitted to EDS on       PASRR number received on       Existing PASRR number confirmed on 07/09/17     FL2 transmitted to all facilities in geographic area requested by pt/family on 07/09/17     FL2 transmitted to all facilities within larger geographic area on       Patient informed that his/her managed care company has contracts with or will negotiate with certain facilities, including the following:        Yes   Patient/family informed of bed offers received.  Patient chooses bed at  [facility] )     Physician recommends and patient chooses bed at      Patient to be transferred to  [facility] ) on 07/10/17.  Patient to be transferred to facility by  [facility] )     Patient family notified on 07/10/17 of transfer.  Name of family member notified:   (Patient's daughter Kristin Graham is aware of D/C today. )     PHYSICIAN        Additional Comment:    _______________________________________________ Kristin Graham, Kristin Crocker, LCSW 07/10/2017, 1:52 PM

## 2017-07-10 NOTE — Progress Notes (Signed)
Report called to [facility]. Spoke with Boneta Lucks nurse.  EMS called for transportation.  AVS and prescription for tramadol in package.   Stephannie Peters, RN

## 2017-07-10 NOTE — Progress Notes (Signed)
[facility]  Patient Status: Active  [facility] is providing the following services: PT  If patient discharges after hours, please call [phone].   Kristin Graham 07/10/2017, 10:35 AM

## 2017-07-10 NOTE — Progress Notes (Signed)
Subjective :Patient is day 3 admission for right superior pubic ramus fracture. Patient reports no pain once again. Rested better during the evening. No complaints this morning. Patient continues to be very confused.  Objective: Vital signs in last 24 hours: Temp:  [97.7 F (36.5 C)-98.2 F (36.8 C)] 97.7 F (36.5 C) (09/30 2004) Pulse Rate:  [81-92] 92 (09/30 2004) Resp:  [18-19] 19 (09/30 2004) BP: (134)/(62-66) 134/62 (09/30 2004) SpO2:  [92 %-94 %] 92 % (09/30 2004) Swelling the lower feet continue to show 2+ pitting edema Patient continues to be able to dorsiflex and plantarflex her feet with no problems or increased pain.  Intake/Output from previous day: 09/30 0701 - 10/01 0700 In: 765.8 [P.O.:240; I.V.:525.8] Out: 1075 [Urine:1075] Intake/Output this shift: No intake/output data recorded.   Recent Labs  07/07/17 2310 07/08/17 0433  HGB 14.6 13.9    Recent Labs  07/07/17 2310 07/08/17 0433  WBC 20.1* 13.3*  RBC 4.87 4.54  HCT 43.4 41.4  PLT 170 164    Recent Labs  07/07/17 2310 07/08/17 0433  NA 140 142  K 4.5 3.7  CL 102 100*  CO2 26 32  BUN 14 15  CREATININE 0.59 0.56  GLUCOSE 116* 137*  CALCIUM 8.8* 8.5*   No results for input(s): LABPT, INR in the last 72 hours.  Neurologically intact Neurovascular intact Sensation intact distally Intact pulses distally Dorsiflexion/Plantar flexion intact  Assessment/Plan: Right pubic ramus fracture Case management to assist with discharge planning Physical therapy today Bowel movement today Labs in am Plan to discharge when medically cleared. Would recommend continue Lovenox 40 mg daily for 14 days at discharge. Follow-up [facility] orthopedics with Dr. Joice Lofts or [facility] in 4 weeks. Continue compression wraps while in rehabilitation   Kerryn Tennant R. 07/10/2017, 7:29 AM

## 2017-07-10 NOTE — Clinical Social Work Note (Signed)
Clinical Social Work Assessment  Patient Details  Name: Kristin Graham MRN: [id number] Date of Birth: 1915/10/30  Date of referral:  07/10/17               Reason for consult:  Facility Placement                Permission sought to share information with:  Oceanographer granted to share information::  Yes, Verbal Permission Granted  Name::      Skilled Nursing Facility   Agency::   [place]   Relationship::     Contact Information:     Housing/Transportation Living arrangements for the past 2 months:  Single Family Home Source of Information:  Adult Children Patient Interpreter Needed:  None Criminal Activity/Legal Involvement Pertinent to Current Situation/Hospitalization:  No - Comment as needed Significant Relationships:  Adult Children Lives with:  Self Do you feel safe going back to the place where you live?  Yes Need for family participation in patient care:  Yes (Comment)  Care giving concerns:  Patient lives in [place] alone however her daughter Junious Dresser lives next door.    Social Worker assessment / plan:  Visual merchandiser (CSW) received SNF consult. PT is recommending SNF. CSW attempted to meet with patient however she was asleep. CSW contacted patient's daughter Junious Dresser. Per daughter she is patient's HPOA and patient's son lives in [place]. Per daughter she has been patient's caregiver for the past 17 years since patient's spouse passed away. Per daughter her and patient live in different homes but share 14 acres. Daughter reported that patient walks with a walker at baseline and has home health PT through [facility]. CSW explained SNF process and that [facility] will have to approve SNF. CSW also explained long term care options for SNF including private pay and medicaid. Daughter is agreeable to SNF search for short term rehab in [place]. Per daughter she plans on patient living and dying at home and has a hospital  bed at home already. FL2 complete and faxed out. CSW started [facility] SNF authorization through [place] [facility].   CSW presented bed offers to daughter and she chose [facility]	[place]. Patient is medically stable for D/C to [facility] today. Per [facility] admissions coordinator at [facility] patient can come today to room 203-B. RN will call report at [phone] and arrange EMS for transport. [facility] Medicare SNF authorization has been received, auth # [id number]. CSW sent D/C orders to [facility] via H[facility]. Patient is aware of above. Patient's daughter Junious Dresser is aware of above. Please reconsult if future social work needs arise. CSW signing off.    Employment status:  Disabled (Comment on whether or not currently receiving Disability) Insurance information:  Managed Medicare PT Recommendations:  Skilled Nursing Facility Information / Referral to community resources:  Skilled Nursing Facility  Patient/Family's Response to care:  Patient and her daughter are agreeable for patient to go to [facility]	[place].    Patient/Family's Understanding of and Emotional Response to Diagnosis, Current Treatment, and Prognosis:  Patient and her daughter were very pleasant and thanked CSW for assistance.   Emotional Assessment Appearance:  Appears stated age Attitude/Demeanor/Rapport:    Affect (typically observed):  Pleasant Orientation:  Oriented to Self, Oriented to Place, Fluctuating Orientation (Suspected and/or reported Sundowners), Oriented to  Time Alcohol / Substance use:  Not Applicable Psych involvement (Current and /or in the community):  No (Comment)  Discharge Needs  Concerns to be addressed:  Discharge Planning Concerns Readmission within the last 30 days:  No Current discharge risk:  Chronically ill, Dependent with Mobility Barriers to Discharge:  No Barriers Identified   Nataliya Graig, Darleen Crocker, LCSW 07/10/2017, 1:53 PM

## 2017-07-11 ENCOUNTER — Other Ambulatory Visit: Payer: Self-pay

## 2017-07-11 ENCOUNTER — Other Ambulatory Visit
Admission: RE | Admit: 2017-07-11 | Discharge: 2017-07-11 | Disposition: A | Discharge status: Home / Self Care | Payer: Medicare Other | Source: Ambulatory Visit | Attending: Internal Medicine | Admitting: Internal Medicine

## 2017-07-11 DIAGNOSIS — R0902 Hypoxemia: Secondary | ICD-10-CM | POA: Diagnosis not present

## 2017-07-11 DIAGNOSIS — R5383 Other fatigue: Secondary | ICD-10-CM | POA: Diagnosis present

## 2017-07-11 LAB — CBC WITH DIFFERENTIAL/PLATELET
BASOS PCT: 1 %
Basophils Absolute: 0 10*3/uL (ref 0–0.1)
Eosinophils Absolute: 0 10*3/uL (ref 0–0.7)
Eosinophils Relative: 0 %
HEMATOCRIT: 39.1 % (ref 35.0–47.0)
HEMOGLOBIN: 13.1 g/dL (ref 12.0–16.0)
LYMPHS ABS: 2 10*3/uL (ref 1.0–3.6)
LYMPHS PCT: 24 %
MCH: 30.7 pg (ref 26.0–34.0)
MCHC: 33.6 g/dL (ref 32.0–36.0)
MCV: 91.4 fL (ref 80.0–100.0)
MONO ABS: 1.1 10*3/uL — AB (ref 0.2–0.9)
MONOS PCT: 13 %
NEUTROS ABS: 5.1 10*3/uL (ref 1.4–6.5)
NEUTROS PCT: 62 %
Platelets: 145 10*3/uL — ABNORMAL LOW (ref 150–440)
RBC: 4.27 MIL/uL (ref 3.80–5.20)
RDW: 12.5 % (ref 11.5–14.5)
WBC: 8.3 10*3/uL (ref 3.6–11.0)

## 2017-07-11 LAB — COMPREHENSIVE METABOLIC PANEL
ALBUMIN: 2.8 g/dL — AB (ref 3.5–5.0)
ALK PHOS: 49 U/L (ref 38–126)
ALT: 22 U/L (ref 14–54)
ANION GAP: 8 (ref 5–15)
AST: 25 U/L (ref 15–41)
BILIRUBIN TOTAL: 0.7 mg/dL (ref 0.3–1.2)
BUN: 19 mg/dL (ref 6–20)
CALCIUM: 8 mg/dL — AB (ref 8.9–10.3)
CO2: 29 mmol/L (ref 22–32)
Chloride: 102 mmol/L (ref 101–111)
Creatinine, Ser: 0.43 mg/dL — ABNORMAL LOW (ref 0.44–1.00)
GFR calc Af Amer: >60 mL/min (ref >60)
GLUCOSE: 98 mg/dL (ref 65–99)
Potassium: 3.5 mmol/L (ref 3.5–5.1)
Sodium: 139 mmol/L (ref 135–145)
TOTAL PROTEIN: 6.3 g/dL — AB (ref 6.5–8.1)

## 2017-07-11 MED ORDER — TRAMADOL HCL 50 MG PO TABS: ORAL_TABLET | ORAL | 1 refills | Status: DC

## 2017-07-11 NOTE — Telephone Encounter (Signed)
Rx sent to [facility] phone : [phone] , fax : [phone]  

## 2017-07-14 ENCOUNTER — Encounter: Payer: Self-pay | Admitting: Gerontology

## 2017-07-14 ENCOUNTER — Non-Acute Institutional Stay (SKILLED_NURSING_FACILITY): Payer: Medicare Other | Admitting: Gerontology

## 2017-07-14 DIAGNOSIS — S3282XD Multiple fractures of pelvis without disruption of pelvic ring, subsequent encounter for fracture with routine healing: Secondary | ICD-10-CM

## 2017-07-14 NOTE — Progress Notes (Signed)
Location:    [facility] Room Number: 203B Place of Service:  SNF [phone]) Provider:  Toni Arthurs, NP-C  Adin Hector, MD  Patient Care Team: Adin Hector, MD as PCP - General (Internal Medicine)  Extended Emergency Contact Information Primary Emergency Contact: Nasser,[name] Address: [address]          [place], [place] [address] Johnnette Litter of [place] Phone: [phone] Mobile Phone: [phone] Relation: Daughter Secondary Emergency Contact: Orlean Bradford. Address: [address]          [place], [place] [address] Johnnette Litter of [place] Phone: [phone] Mobile Phone: [phone] Relation: Friend  Code Status:  FULL Goals of care: Advanced Directive information Advanced Directives 07/14/2017  Does Patient Have a Medical Advance Directive? Yes  Type of Advance Directive [place]  Does patient want to make changes to medical advance directive? No - Patient declined  Copy of [place] in Chart? No - copy requested     Chief Complaint  Patient presents with  . Hospitalization Follow-up    Follow up on pelvic fracture    HPI:  Pt is a 83 y.o. female seen today for a hospital f/u s/p admission from admission to [facility] for fall with pelvic fractures. Pt is participating with PT and OT. Pt reports her pain is well controlled on current regimen. Denies dyspnea. Pt reports her appetite is fair. Voiding regularly and having regular BMs. Incontinent of B&B. On O2 2L . Lethargic at times, HOH. Pt does not have any complaints at this time. VSS.   Past Medical History:  Diagnosis Date  . Bilateral carpal tunnel syndrome    S/P surgery  . Depression    unspecified  . Foot fracture, right   . Glaucoma   . Left leg weakness    Progressive, evaluated by Ortho. LS spine MRI with enhancing mass involving L2 vertebra. oncology evaluation with Dr. Oliva Bustard 6/11; bone scan negative; PET scan  negative. 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 Neurosurgery consultation arranged by Oncology.  . Left shoulder tendinitis    with partial rotator cuff tear evaluated by Dr. Leanor Kail  . Leg edema    likely venous insufficiency. Declines compression hose. Declines physical therapy. Echo 3/10 with normal heart function, mild MR.  . Osteoarthritis   . Osteoporosis, post-menopausal   . Stroke ([place])    History of CVA versus TIA, 1996, with reported episodes of TIA since then. Left pontine CVA by MRI 12/14.  [name] Urinary incontinence   . Vision loss of left eye    followed by Dr. Tobe Sos. Possibly glaucoma-induced versus ischemic neuropathy from vascular disease.   Past Surgical History:  Procedure Laterality Date  . CARPAL TUNNEL RELEASE  2003  . CATARACT EXTRACTION, BILATERAL  2000  . CHOLECYSTECTOMY    . JOINT REPLACEMENT  07/2005   left total knee, Dr. Leanor Kail    Allergies  Allergen Reactions  . Bee Venom Swelling  . Chocolate   . Nsaids   . Vicodin [Hydrocodone-Acetaminophen]     Allergies as of 07/14/2017      Reactions   Bee Venom Swelling   Chocolate    Nsaids    Vicodin [hydrocodone-acetaminophen]       Medication List       Accurate as of 07/14/17  3:51 PM. Always use your most recent med list.  acetaminophen 500 MG tablet Commonly known as:  TYLENOL Take 250 mg by mouth at bedtime. 1/2 tablet per home routine   acetaminophen 325 MG tablet Commonly known as:  TYLENOL Take 650 mg by mouth every 4 (four) hours as needed.   ascorbic acid 1000 MG tablet Commonly known as:  VITAMIN C Take 1,000 mg by mouth 2 (two) times daily.   aspirin 325 MG tablet Take 325 mg by mouth daily.   atorvastatin 10 MG tablet Commonly known as:  LIPITOR Take 10 mg by mouth daily.   bimatoprost 0.03 % ophthalmic solution Commonly known as:  LUMIGAN Place 1 drop into both eyes at bedtime.   calcium carbonate  1250 (500 Ca) MG tablet Commonly known as:  OS-CAL - dosed in mg of elemental calcium Take 1 tablet by mouth 2 (two) times daily.   camphor-menthol lotion Commonly known as:  SARNA Apply 1 application topically daily as needed for itching. Apply to affected area   CENTRUM SILVER tablet Take 1 tablet by mouth daily.   CRANBERRY PLUS VITAMIN C 4200-20-3 MG-MG-UNIT Caps Generic drug:  Cranberry-Vitamin C-Vitamin E Take 1 capsule by mouth daily.   dorzolamide-timolol 22.3-6.8 MG/ML ophthalmic solution Commonly known as:  COSOPT Place 1 drop into both eyes 2 (two) times daily.   enoxaparin 40 MG/0.4ML injection Commonly known as:  LOVENOX Inject 40 mg into the skin daily.   furosemide 20 MG tablet Commonly known as:  LASIX Take 30 mg by mouth See admin instructions. Take (1.5 tabs)  once a day for 5 days a week on Sun, Mon, Wed, Thu, Sat   furosemide 40 MG tablet Commonly known as:  LASIX Take 40 mg by mouth See admin instructions. Take 40 mg 2 days a week on Tue and Friday   Ginkgo Biloba 60 MG Caps Take 1 capsule by mouth daily.   magnesium oxide 400 MG tablet Commonly known as:  MAG-OX Take 400 mg by mouth daily.   RISA-BID PROBIOTIC Tabs Take 1 tablet by mouth daily.   traMADol 50 MG tablet Commonly known as:  ULTRAM Take 25 mg by mouth at bedtime. 1/2 tablet   vitamin B-12 100 MCG tablet Commonly known as:  CYANOCOBALAMIN Take 100 mcg by mouth daily.   Vitamin E 400 units Tabs Take 1 tablet by mouth daily.   Vitamin-B Complex Tabs Take 1 tablet by mouth daily.   ZINC-220 PO Take 1 tablet by mouth daily.       Review of Systems  Constitutional: Negative for activity change, appetite change, chills, diaphoresis and fever.  HENT: Negative for congestion, sneezing, sore throat, trouble swallowing and voice change.   Respiratory: Negative for apnea, cough, choking, chest tightness, shortness of breath and wheezing.   Cardiovascular: Negative for chest pain,  palpitations and leg swelling.  Gastrointestinal: Negative for abdominal distention, abdominal pain, constipation, diarrhea and nausea.  Genitourinary: Negative for difficulty urinating, dysuria, frequency and urgency.  Musculoskeletal: Positive for arthralgias (typical arthritis) and myalgias. Negative for back pain and gait problem.  Skin: Negative for color change, pallor, rash and wound.  Neurological: Negative for dizziness, tremors, syncope, speech difficulty, weakness, numbness and headaches.  Psychiatric/Behavioral: Negative for agitation and behavioral problems.  All other systems reviewed and are negative.   Immunization History  Administered Date(s) Administered  . Influenza Inj Mdck Quad Pf 07/20/2016  . Influenza, High Dose Seasonal PF 07/09/2017  . Influenza-Unspecified 09/26/2014, 09/09/2015  . PPD Test 07/11/2017  . Pneumococcal Polysaccharide-23 12/25/2013  . Tdap  08/04/2010   Pertinent  Health Maintenance Due  Topic Date Due  . DEXA SCAN  24-Oct-201984  . PNA vac Low Risk Adult (1 of 2 - PCV13) 24-Oct-201984  . INFLUENZA VACCINE  Completed   No flowsheet data found. Functional Status Survey:    Vitals:   07/14/17 1413  BP: (!) 141/64  Pulse: 84  Resp: 20  Temp: 98.1 F (36.7 C)  SpO2: 94%  Weight: 197 lb 6.4 oz (89.5 kg)  Height: 5' 5"  (1.651 m)   Body mass index is 32.85 kg/m. Physical Exam  Constitutional: She is oriented to person, place, and time. Vital signs are normal. She appears well-developed and well-nourished. She is active and cooperative. She does not appear ill. No distress. Nasal cannula in place.  HENT:  Head: Normocephalic and atraumatic.  Mouth/Throat: Uvula is midline, oropharynx is clear and moist and mucous membranes are normal. Mucous membranes are not pale, not dry and not cyanotic.  HOH  Eyes: Pupils are equal, round, and reactive to light. Conjunctivae, EOM and lids are normal.  Neck: Trachea normal, normal range of motion and full  passive range of motion without pain. Neck supple. No JVD present. No tracheal deviation, no edema and no erythema present. No thyromegaly present.  Cardiovascular: Normal rate, regular rhythm, normal heart sounds, intact distal pulses and normal pulses.  Exam reveals no gallop, no distant heart sounds and no friction rub.   No murmur heard. Pulses:      Dorsalis pedis pulses are 2+ on the right side, and 2+ on the left side.  1+ BLE edema  Pulmonary/Chest: Effort normal. No accessory muscle usage. No respiratory distress. She has decreased breath sounds in the right lower field and the left lower field. She has no wheezes. She has no rhonchi. She has no rales. She exhibits no tenderness.  Abdominal: Soft. Normal appearance and bowel sounds are normal. She exhibits no distension and no ascites. There is no tenderness.  Musculoskeletal: Normal range of motion. She exhibits no edema or tenderness.  Expected osteoarthritis, stiffness  Neurological: She is alert and oriented to person, place, and time. She has normal strength.  Skin: Skin is warm, dry and intact. She is not diaphoretic. No cyanosis. No pallor. Nails show no clubbing.  Psychiatric: She has a normal mood and affect. Her speech is normal and behavior is normal. Judgment and thought content normal. Cognition and memory are normal.  Nursing note and vitals reviewed.   Labs reviewed:  Recent Labs  07/07/17 2310 07/08/17 0433 07/11/17 1530  NA 140 142 139  K 4.5 3.7 3.5  CL 102 100* 102  CO2 26 32 29  GLUCOSE 116* 137* 98  BUN 14 15 19   CREATININE 0.59 0.56 0.43*  CALCIUM 8.8* 8.5* 8.0*    Recent Labs  07/11/17 1530  AST 25  ALT 22  ALKPHOS 49  BILITOT 0.7  PROT 6.3*  ALBUMIN 2.8*    Recent Labs  07/07/17 2310 07/08/17 0433 07/11/17 1530  WBC 20.1* 13.3* 8.3  NEUTROABS  --   --  5.1  HGB 14.6 13.9 13.1  HCT 43.4 41.4 39.1  MCV 89.0 91.3 91.4  PLT 170 164 145*   Lab Results  Component Value Date   TSH  0.975 09/25/2013   No results found for: HGBA1C No results found for: CHOL, HDL, LDLCALC, LDLDIRECT, TRIG, CHOLHDL  Significant Diagnostic Results in last 30 days:  No results found.  Assessment/Plan 1. Multiple closed fractures of pelvis without  disruption of pelvic ring with routine healing, subsequent encounter  Continue PT/ OT  Continue exercises as taught by PT/OT  Turn/ reposition frequently  Encourage po food and fluid intake  Continue APAP 650 mg po Q 4 hours prn pain  Continue APAP 250 mg po Q HS for pain  Continue Tramadol 25 mg po Q HS per home regimen for pain  Continue Tramadol 50 mg po Q 6 hours prn pain  Follow up with Orthopedist as instructed  Family/ staff Communication:   Total Time:  Documentation:  Face to Face:  Family/Phone:   Labs/tests ordered:  Cbc and met c results reviewed  Vikki Ports, NP-C [facility] [address], [place] [address] Cell Phone (Mon-Fri 8am-5pm):  [phone] On Call:  [phone] & follow prompts after 5pm & weekends Office Phone:  [phone] Office Fax:  [phone]

## 2017-07-25 ENCOUNTER — Non-Acute Institutional Stay (SKILLED_NURSING_FACILITY): Payer: Medicare Other | Admitting: Gerontology

## 2017-07-25 DIAGNOSIS — I509 Heart failure, unspecified: Secondary | ICD-10-CM

## 2017-07-25 DIAGNOSIS — E46 Unspecified protein-calorie malnutrition: Secondary | ICD-10-CM | POA: Diagnosis not present

## 2017-07-25 DIAGNOSIS — S3282XD Multiple fractures of pelvis without disruption of pelvic ring, subsequent encounter for fracture with routine healing: Secondary | ICD-10-CM | POA: Diagnosis not present

## 2017-07-26 ENCOUNTER — Encounter: Payer: Self-pay | Admitting: Gerontology

## 2017-07-26 DIAGNOSIS — E46 Unspecified protein-calorie malnutrition: Secondary | ICD-10-CM | POA: Insufficient documentation

## 2017-07-26 NOTE — Progress Notes (Signed)
Location:   [facility] Room Number: 203B Place of Service:  SNF [phone]) Provider:  Toni Arthurs, NP-C  Adin Hector, MD  Patient Care Team: Adin Hector, MD as PCP - General (Internal Medicine)  Extended Emergency Contact Information Primary Emergency Contact: Polak,[name] Address: [address]          [place], [place] [address] Johnnette Litter of [place] Phone: [phone] Mobile Phone: [phone] Relation: Daughter Secondary Emergency Contact: Orlean Bradford. Address: [address]          [place], [place] [address] Johnnette Litter of [place] Phone: [phone] Mobile Phone: [phone] Relation: Friend  Code Status:  FULL Goals of care: Advanced Directive information Advanced Directives 07/26/2017  Does Patient Have a Medical Advance Directive? No  Type of Advance Directive Healthcare Power of Attorney  Does patient want to make changes to medical advance directive? No - Patient declined  Copy of [place] in Chart? No - copy requested     Chief Complaint  Patient presents with  . Medical Management of Chronic Issues    Routine Visit    HPI:  Pt is a 83 y.o. female seen today for medical management of chronic diseases. Pt is admitted to the facility and undergoing rehab for strengthening after hospitalization for fall and subsequent pelvic fracture that is non-operable. Pt has been participating in PT/OT. She is still weak and needs assistance with ADLs. Appetite has improved. Pain controlled on current regimen. Pt remains on O2 2L [place]. Will obtain CXR to evaluate CHF, etc and begin to wean O2. Pt denies n/v/d/f/c/cp/sob/ha/abd pain/dizziness/cough. Does complain of leg pain with palpation, but reports this is chronic. Otherwise, VSS. No other complaints.    Past Medical History:  Diagnosis Date  . Bilateral carpal tunnel syndrome    S/P surgery  . Depression    unspecified  . Foot fracture, right     . Glaucoma   . Left leg weakness    Progressive, evaluated by Ortho. LS spine MRI with enhancing mass involving L2 vertebra. oncology evaluation with Dr. Oliva Bustard 6/11; bone scan negative; PET scan negative. 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 Neurosurgery consultation arranged by Oncology.  . Left shoulder tendinitis    with partial rotator cuff tear evaluated by Dr. Leanor Kail  . Leg edema    likely venous insufficiency. Declines compression hose. Declines physical therapy. Echo 3/10 with normal heart function, mild MR.  . Osteoarthritis   . Osteoporosis, post-menopausal   . Stroke ([place])    History of CVA versus TIA, 1996, with reported episodes of TIA since then. Left pontine CVA by MRI 12/14.  [name] Urinary incontinence   . Vision loss of left eye    followed by Dr. Tobe Sos. Possibly glaucoma-induced versus ischemic neuropathy from vascular disease.   Past Surgical History:  Procedure Laterality Date  . CARPAL TUNNEL RELEASE  2003  . CATARACT EXTRACTION, BILATERAL  2000  . CHOLECYSTECTOMY    . JOINT REPLACEMENT  07/2005   left total knee, Dr. Leanor Kail    Allergies  Allergen Reactions  . Bee Venom Swelling  . Chocolate   . Nsaids   . Vicodin [Hydrocodone-Acetaminophen]     Allergies as of 07/25/2017      Reactions   Bee Venom Swelling   Chocolate    Nsaids    Vicodin [hydrocodone-acetaminophen]       Medication List  Accurate as of 07/25/17 11:59 PM. Always use your most recent med list.          acetaminophen 500 MG tablet Commonly known as:  TYLENOL Take 250 mg by mouth at bedtime. 1/2 tablet per home routine   acetaminophen 325 MG tablet Commonly known as:  TYLENOL Take 650 mg by mouth every 4 (four) hours as needed.   ascorbic acid 1000 MG tablet Commonly known as:  VITAMIN C Take 1,000 mg by mouth 2 (two) times daily.   aspirin 325 MG tablet Take 325 mg by mouth daily.   atorvastatin 10  MG tablet Commonly known as:  LIPITOR Take 10 mg by mouth daily.   bimatoprost 0.03 % ophthalmic solution Commonly known as:  LUMIGAN Place 1 drop into both eyes at bedtime.   calcium carbonate 1250 (500 Ca) MG tablet Commonly known as:  OS-CAL - dosed in mg of elemental calcium Take 1 tablet by mouth 2 (two) times daily.   camphor-menthol lotion Commonly known as:  SARNA Apply 1 application topically daily as needed for itching. Apply to affected area   CENTRUM SILVER tablet Take 1 tablet by mouth daily.   CRANBERRY PLUS VITAMIN C 4200-20-3 MG-MG-UNIT Caps Generic drug:  Cranberry-Vitamin C-Vitamin E Take 1 capsule by mouth daily.   dorzolamide-timolol 22.3-6.8 MG/ML ophthalmic solution Commonly known as:  COSOPT Place 1 drop into both eyes 2 (two) times daily.   furosemide 20 MG tablet Commonly known as:  LASIX Take 30 mg by mouth See admin instructions. Take (1.5 tabs)  once a day for 5 days a week on Sun, Mon, Wed, Thu, Sat   furosemide 40 MG tablet Commonly known as:  LASIX Take 40 mg by mouth See admin instructions. Take 40 mg 2 days a week on Tue and Friday   Ginkgo Biloba 60 MG Caps Take 1 capsule by mouth daily.   magnesium hydroxide 400 MG/5ML suspension Commonly known as:  MILK OF MAGNESIA Take 30 mLs by mouth every 4 (four) hours as needed for mild constipation. If constipation/ no BM for 2 days   magnesium oxide 400 MG tablet Commonly known as:  MAG-OX Take 400 mg by mouth daily.   nystatin cream Commonly known as:  MYCOSTATIN Apply 1 application topically 2 (two) times daily. Apply to affected area of groin for 7 days   OXYGEN Inhale 2 L into the lungs continuous.   RISA-BID PROBIOTIC Tabs Take 1 tablet by mouth daily.   traMADol 50 MG tablet Commonly known as:  ULTRAM Take 25 mg by mouth at bedtime. 1/2 tablet   traMADol 50 MG tablet Commonly known as:  ULTRAM Take 50 mg by mouth every 6 (six) hours as needed.   vitamin B-12 100 MCG  tablet Commonly known as:  CYANOCOBALAMIN Take 100 mcg by mouth daily.   Vitamin E 400 units Tabs Take 1 tablet by mouth daily.   Vitamin-B Complex Tabs Take 1 tablet by mouth daily.   ZINC-220 PO Take 1 tablet by mouth daily.       Review of Systems  Constitutional: Negative for activity change, appetite change, chills, diaphoresis and fever.  HENT: Negative for congestion, sneezing, sore throat, trouble swallowing and voice change.   Respiratory: Negative for apnea, cough, choking, chest tightness, shortness of breath and wheezing.   Cardiovascular: Negative for chest pain, palpitations and leg swelling.  Gastrointestinal: Negative for abdominal distention, abdominal pain, constipation, diarrhea and nausea.  Genitourinary: Negative for difficulty urinating, dysuria, frequency and urgency.  Musculoskeletal: Positive for arthralgias (typical arthritis) and myalgias. Negative for back pain and gait problem.  Skin: Negative for color change, pallor, rash and wound.  Neurological: Negative for dizziness, tremors, syncope, speech difficulty, weakness, numbness and headaches.  Psychiatric/Behavioral: Negative for agitation and behavioral problems.  All other systems reviewed and are negative.   Immunization History  Administered Date(s) Administered  . Influenza Inj Mdck Quad Pf 07/20/2016  . Influenza, High Dose Seasonal PF 07/09/2017  . Influenza-Unspecified 09/26/2014, 09/09/2015  . PPD Test 07/11/2017  . Pneumococcal Polysaccharide-23 12/25/2013  . Tdap 08/04/2010   Pertinent  Health Maintenance Due  Topic Date Due  . DEXA SCAN  2019/03/1383  . PNA vac Low Risk Adult (2 of 2 - PCV13) 12/26/2014  . INFLUENZA VACCINE  Completed   No flowsheet data found. Functional Status Survey:    Vitals:   07/25/17 1031  BP: 123/66  Pulse: 76  Resp: 20  Temp: (!) 97.5 F (36.4 C)  SpO2: 97%  Weight: 192 lb 4.8 oz (87.2 kg)  Height: 5' 5"  (1.651 m)   Body mass index is 32  kg/m. Physical Exam  Constitutional: She is oriented to person, place, and time. Vital signs are normal. She appears well-developed and well-nourished. She is active and cooperative. She does not appear ill. No distress. Nasal cannula in place.  HENT:  Head: Normocephalic and atraumatic.  Mouth/Throat: Uvula is midline, oropharynx is clear and moist and mucous membranes are normal. Mucous membranes are not pale, not dry and not cyanotic.  HOH  Eyes: Pupils are equal, round, and reactive to light. Conjunctivae, EOM and lids are normal.  Neck: Trachea normal, normal range of motion and full passive range of motion without pain. Neck supple. No JVD present. No tracheal deviation, no edema and no erythema present. No thyromegaly present.  Cardiovascular: Normal rate, regular rhythm, normal heart sounds, intact distal pulses and normal pulses.  Exam reveals no gallop, no distant heart sounds and no friction rub.   No murmur heard. Pulses:      Dorsalis pedis pulses are 2+ on the right side, and 2+ on the left side.  1+ BLE edema  Pulmonary/Chest: Effort normal. No accessory muscle usage. No respiratory distress. She has decreased breath sounds in the right lower field and the left lower field. She has no wheezes. She has no rhonchi. She has no rales. She exhibits no tenderness.  Abdominal: Soft. Normal appearance and bowel sounds are normal. She exhibits no distension and no ascites. There is no tenderness.  Musculoskeletal: Normal range of motion. She exhibits no edema or tenderness.  Expected osteoarthritis, stiffness  Neurological: She is alert and oriented to person, place, and time. She has normal strength.  Skin: Skin is warm, dry and intact. She is not diaphoretic. No cyanosis. No pallor. Nails show no clubbing.  Psychiatric: She has a normal mood and affect. Her speech is normal and behavior is normal. Judgment and thought content normal. Cognition and memory are normal.  Nursing note and  vitals reviewed.   Labs reviewed:  Recent Labs  07/07/17 2310 07/08/17 0433 07/11/17 1530  NA 140 142 139  K 4.5 3.7 3.5  CL 102 100* 102  CO2 26 32 29  GLUCOSE 116* 137* 98  BUN 14 15 19   CREATININE 0.59 0.56 0.43*  CALCIUM 8.8* 8.5* 8.0*    Recent Labs  07/11/17 1530  AST 25  ALT 22  ALKPHOS 49  BILITOT 0.7  PROT 6.3*  ALBUMIN 2.8*  Recent Labs  07/07/17 2310 07/08/17 0433 07/11/17 1530  WBC 20.1* 13.3* 8.3  NEUTROABS  --   --  5.1  HGB 14.6 13.9 13.1  HCT 43.4 41.4 39.1  MCV 89.0 91.3 91.4  PLT 170 164 145*   Lab Results  Component Value Date   TSH 0.975 09/25/2013   No results found for: HGBA1C No results found for: CHOL, HDL, LDLCALC, LDLDIRECT, TRIG, CHOLHDL  Significant Diagnostic Results in last 30 days:  Dg Chest 1 View  Result Date: 07/08/2017 CLINICAL DATA:  83 y/o F; fall with elevated white blood cell count. EXAM: CHEST 1 VIEW COMPARISON:  05/20/2017 chest radiograph FINDINGS: Stable mild cardiomegaly. Aortic atherosclerosis with calcification. Hyperinflated lungs with flattened diaphragms suggestive of COPD. No focal consolidation. Stable biapical nodular pleuroparenchymal scarring. No pleural effusion or pneumothorax. No acute osseous abnormality is evident. IMPRESSION: No active disease. Stable mild cardiomegaly. Aortic atherosclerosis. Stable findings suggesting COPD. Electronically Signed   By: Kristine Garbe M.D.   On: 07/08/2017 01:20   Ct Head Wo Contrast  Result Date: 07/07/2017 CLINICAL DATA:  83 y/o  F; status post fall. EXAM: CT HEAD WITHOUT CONTRAST TECHNIQUE: Contiguous axial images were obtained from the base of the skull through the vertex without intravenous contrast. COMPARISON:  09/25/2013 CT of the head. FINDINGS: Brain: No evidence of acute infarction, hemorrhage, hydrocephalus, extra-axial collection or mass lesion/mass effect. Stable moderate chronic microvascular ischemic changes parenchymal volume loss of the  brain for age. Vascular: Calcific atherosclerosis of carotid siphons. No hyperdense vessel. Skull: Normal. Negative for fracture or focal lesion. Sinuses/Orbits: Mild mucosal thickening of the maxillary sinuses with small mucous retention cysts is stable. The normal aeration of mastoid air cells. Bilateral intra-ocular lens replacement. Other: None. IMPRESSION: 1. No acute intracranial abnormality or calvarial fracture. 2. Stable moderate for age chronic microvascular ischemic changes and moderate parenchymal volume loss of the brain. 3. Stable mild maxillary sinus disease. Electronically Signed   By: Kristine Garbe M.D.   On: 07/07/2017 22:51   Ct Pelvis Wo Contrast  Result Date: 07/08/2017 CLINICAL DATA:  83 year old with right hip pain after fall. EXAM: CT PELVIS WITHOUT CONTRAST TECHNIQUE: Multidetector CT imaging of the pelvis was performed following the standard protocol without intravenous contrast. COMPARISON:  Radiographs yesterday FINDINGS: Urinary Tract: Distal ureters are decompressed. The bladder is distended without wall thickening. Bowel: No bowel inflammation. Moderate stool in the included colon. Vascular/Lymphatic: Atherosclerosis.  No evidence pelvic adenopathy. Reproductive: Uterine calcification may be fibroid. No adnexal mass. Other:  No pelvic ascites. Musculoskeletal: Acute fracture of the right superior puboacetabular junction, essentially nondisplaced. No definite extension of the weight-bearing articular surface. Minimally displaced right inferior pubic ramus fracture. Bones are diffusely under mineralized. Moderate osteoarthritis of both hips, with bilateral hip joint effusions. Chronic enthesopathic change of both the ischial tuberosities. 5 cm fluid density structure adjacent to the anterior proximal femur measures simple fluid density, likely fluid distending the bursa, chronic and similar to prior PET 04/07/2010. IMPRESSION: 1. Right pelvic fractures, nondisplaced  involving the right puboacetabular junction and minimally displaced involving the inferior right pubic ramus. 2. Diffuse bony under mineralization. 3. Bilateral hip osteoarthritis and joint effusions. Probable bursal formation adjacent the right proximal femur that is chronic and similar to remote prior PET. Electronically Signed   By: Jeb Levering M.D.   On: 07/08/2017 00:44   Dg Hip Unilat  With Pelvis 2-3 Views Right  Result Date: 07/07/2017 CLINICAL DATA:  83 y/o  F; status post fall  with right hip pain. EXAM: DG HIP (WITH OR WITHOUT PELVIS) 2-3V RIGHT COMPARISON:  None. FINDINGS: The bones are demineralized. Cortical lucency within the right iliopectineal line. No proximal femur fracture identified. No hip dislocation. Moderate right hip osteoarthrosis with joint space narrowing. Symphysis pubis and sacroiliac joints are maintained. Vascular calcification. IMPRESSION: 1. Cortical lucency within right iliopectineal line may represent a lateral superior pubic ramus/anterior column of acetabulum fracture. 2. No proximal femur fracture identified. If clinically indicated, consider CT or MRI as radiographs can have low sensitivity in the elderly. Electronically Signed   By: Kristine Garbe M.D.   On: 07/07/2017 22:59    Assessment/Plan 1. Multiple closed fractures of pelvis without disruption of pelvic ring with routine healing, subsequent encounter  Continue working with PT/OT  Continue scheduled APAP and Tramadol per home regimen for chronic OA  Continue APAP 650 mg po Q 4 hours prn pain  Continue Tramadol 50 mg po Q 6 hours prn pain  Turn/reposition frequently  Assist with ADLs as appropriate  Follow up with Orthopedist as instructed  2. Chronic congestive heart failure, unspecified heart failure type ([place])  Follow up CXR  Continue Lasix 30 mg and 40 mg po alternating days per home regimen  Follow up with cardiologist/pcp after discharge for continued management  3.  Protein-calorie malnutrition, unspecified severity ([facility])  Pro-stat 30 mL po BID  Ensure Enlive 1 bottle po BID  Family/ staff Communication:   Total Time:  Documentation:  Face to Face:  Family/Phone:   Labs/tests ordered:  CXR, cbc, met c  Medication list reviewed and assessed for continued appropriateness. Monthly medication orders reviewed and signed.  Vikki Ports, NP-C [facility] [phone], [place] [address] Cell Phone (Mon-Fri 8am-5pm):  [phone] On Call:  [phone] & follow prompts after 5pm & weekends Office Phone:  [phone] Office Fax:  [phone]

## 2017-07-27 ENCOUNTER — Other Ambulatory Visit
Admission: RE | Admit: 2017-07-27 | Discharge: 2017-07-27 | Disposition: A | Discharge status: Home / Self Care | Payer: Medicare Other | Source: Ambulatory Visit | Attending: Gerontology | Admitting: Gerontology

## 2017-07-27 DIAGNOSIS — I5032 Chronic diastolic (congestive) heart failure: Secondary | ICD-10-CM | POA: Diagnosis present

## 2017-07-27 LAB — CBC WITH DIFFERENTIAL/PLATELET
BASOS ABS: 0.1 10*3/uL (ref 0–0.1)
Basophils Relative: 1 %
EOS PCT: 2 %
Eosinophils Absolute: 0.1 10*3/uL (ref 0–0.7)
HEMATOCRIT: 39.5 % (ref 35.0–47.0)
HEMOGLOBIN: 13.2 g/dL (ref 12.0–16.0)
LYMPHS ABS: 2.7 10*3/uL (ref 1.0–3.6)
LYMPHS PCT: 30 %
MCH: 29.8 pg (ref 26.0–34.0)
MCHC: 33.4 g/dL (ref 32.0–36.0)
MCV: 89.1 fL (ref 80.0–100.0)
Monocytes Absolute: 0.5 10*3/uL (ref 0.2–0.9)
Monocytes Relative: 6 %
NEUTROS ABS: 5.7 10*3/uL (ref 1.4–6.5)
NEUTROS PCT: 61 %
PLATELETS: 302 10*3/uL (ref 150–440)
RBC: 4.44 MIL/uL (ref 3.80–5.20)
RDW: 12.6 % (ref 11.5–14.5)
WBC: 9.1 10*3/uL (ref 3.6–11.0)

## 2017-07-27 LAB — COMPREHENSIVE METABOLIC PANEL
ALT: 32 U/L (ref 14–54)
ANION GAP: 10 (ref 5–15)
AST: 26 U/L (ref 15–41)
Albumin: 2.8 g/dL — ABNORMAL LOW (ref 3.5–5.0)
Alkaline Phosphatase: 122 U/L (ref 38–126)
BILIRUBIN TOTAL: 0.7 mg/dL (ref 0.3–1.2)
BUN: 10 mg/dL (ref 6–20)
CHLORIDE: 96 mmol/L — AB (ref 101–111)
CO2: 30 mmol/L (ref 22–32)
Calcium: 8.7 mg/dL — ABNORMAL LOW (ref 8.9–10.3)
Creatinine, Ser: 0.35 mg/dL — ABNORMAL LOW (ref 0.44–1.00)
Glucose, Bld: 113 mg/dL — ABNORMAL HIGH (ref 65–99)
POTASSIUM: 3.8 mmol/L (ref 3.5–5.1)
Sodium: 136 mmol/L (ref 135–145)
TOTAL PROTEIN: 6.3 g/dL — AB (ref 6.5–8.1)

## 2017-12-27 ENCOUNTER — Encounter: Payer: Self-pay | Admitting: Emergency Medicine

## 2017-12-27 ENCOUNTER — Observation Stay
Admission: EM | Admit: 2017-12-27 | Discharge: 2017-12-31 | Disposition: A | Discharge status: Home / Self Care | Payer: Medicare Other | Attending: Internal Medicine | Admitting: Internal Medicine

## 2017-12-27 DIAGNOSIS — I5033 Acute on chronic diastolic (congestive) heart failure: Secondary | ICD-10-CM | POA: Insufficient documentation

## 2017-12-27 DIAGNOSIS — Z8673 Personal history of transient ischemic attack (TIA), and cerebral infarction without residual deficits: Secondary | ICD-10-CM | POA: Diagnosis not present

## 2017-12-27 DIAGNOSIS — H409 Unspecified glaucoma: Secondary | ICD-10-CM | POA: Diagnosis not present

## 2017-12-27 DIAGNOSIS — I872 Venous insufficiency (chronic) (peripheral): Secondary | ICD-10-CM | POA: Insufficient documentation

## 2017-12-27 DIAGNOSIS — I700 Atherosclerosis of aorta: Secondary | ICD-10-CM | POA: Diagnosis not present

## 2017-12-27 DIAGNOSIS — Z9981 Dependence on supplemental oxygen: Secondary | ICD-10-CM | POA: Diagnosis not present

## 2017-12-27 DIAGNOSIS — J9601 Acute respiratory failure with hypoxia: Secondary | ICD-10-CM | POA: Diagnosis not present

## 2017-12-27 DIAGNOSIS — R112 Nausea with vomiting, unspecified: Secondary | ICD-10-CM | POA: Diagnosis present

## 2017-12-27 DIAGNOSIS — L8990 Pressure ulcer of unspecified site, unspecified stage: Secondary | ICD-10-CM

## 2017-12-27 DIAGNOSIS — F0390 Unspecified dementia without behavioral disturbance: Secondary | ICD-10-CM | POA: Insufficient documentation

## 2017-12-27 DIAGNOSIS — R1084 Generalized abdominal pain: Secondary | ICD-10-CM | POA: Insufficient documentation

## 2017-12-27 DIAGNOSIS — K529 Noninfective gastroenteritis and colitis, unspecified: Secondary | ICD-10-CM | POA: Diagnosis present

## 2017-12-27 DIAGNOSIS — A0811 Acute gastroenteropathy due to Norwalk agent: Principal | ICD-10-CM | POA: Insufficient documentation

## 2017-12-27 DIAGNOSIS — Z79899 Other long term (current) drug therapy: Secondary | ICD-10-CM | POA: Insufficient documentation

## 2017-12-27 DIAGNOSIS — R062 Wheezing: Secondary | ICD-10-CM

## 2017-12-27 DIAGNOSIS — Z7982 Long term (current) use of aspirin: Secondary | ICD-10-CM | POA: Diagnosis not present

## 2017-12-27 DIAGNOSIS — E785 Hyperlipidemia, unspecified: Secondary | ICD-10-CM | POA: Diagnosis not present

## 2017-12-27 NOTE — ED Triage Notes (Signed)
Pt arrived via EMS from [facility] where report given was N/V/D x24hours, phenergan given, family request for hospital evaluation. Pt has hx/o dementia and was from facility due to recent pelvic fracture to the left side. MD at bedside for further evaluation.

## 2017-12-27 NOTE — ED Provider Notes (Signed)
[facility] Emergency Department Provider Note   First MD Initiated Contact with Patient 12/27/17 2320     (approximate)  I have reviewed the triage vital signs and the nursing notes.  Level 5 caveat: History limited secondary to altered mental status HISTORY  Chief Complaint Nausea; Emesis; and Diarrhea   HPI Kristin Graham is a 84 y.o. female with below list of chronic medical conditions presents to the emergency department via EMS from [facility] nursing facility with nausea vomiting diarrhea times 24 hours.  Patient is currently at [facility] secondary to a left pelvis fracture.  EMS states that the patient was given Phenergan secondary nausea and vomiting and since that time is been very somnolent.   Past Medical History:  Diagnosis Date  . Bilateral carpal tunnel syndrome    S/P surgery  . Depression    unspecified  . Foot fracture, right   . Glaucoma   . Left leg weakness    Progressive, evaluated by Ortho. LS spine MRI with enhancing mass involving L2 vertebra. oncology evaluation with Dr. Doylene Canning 6/11; bone scan negative; PET scan negative. 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 Neurosurgery consultation arranged by Oncology.  . Left shoulder tendinitis    with partial rotator cuff tear evaluated by Dr. Erin Sons  . Leg edema    likely venous insufficiency. Declines compression hose. Declines physical therapy. Echo 3/10 with normal heart function, mild MR.  . Osteoarthritis   . Osteoporosis, post-menopausal   . Stroke (HCC)    History of CVA versus TIA, 1996, with reported episodes of TIA since then. Left pontine CVA by MRI 12/14.  [name] Urinary incontinence   . Vision loss of left eye    followed by Dr. Fransico Michael. Possibly glaucoma-induced versus ischemic neuropathy from vascular disease.    Patient Active Problem List   Diagnosis Date Noted  . Gastroenteritis 12/28/2017  . Protein-calorie  malnutrition (HCC) 07/26/2017  . Hip pain 07/09/2017  . Pelvic fracture (HCC) 07/08/2017  . Cataract 07/08/2017  . CHF (congestive heart failure) (HCC) 07/08/2017  . Dyslipidemia 07/08/2017  . TIA (transient ischemic attack) 07/08/2017    Past Surgical History:  Procedure Laterality Date  . CARPAL TUNNEL RELEASE  2003  . CATARACT EXTRACTION, BILATERAL  2000  . CHOLECYSTECTOMY    . JOINT REPLACEMENT  07/2005   left total knee, Dr. Erin Sons    Prior to Admission medications   Medication Sig Start Date End Date Taking? Authorizing Provider  acetaminophen (TYLENOL) 325 MG tablet Take 650 mg by mouth every 4 (four) hours as needed.   Yes [provider]  acetaminophen (TYLENOL) 500 MG tablet Take 250 mg by mouth at bedtime. 1/2 tablet per home routine   Yes [provider]  ascorbic acid (VITAMIN C) 1000 MG tablet Take 1,000 mg by mouth 2 (two) times daily.   Yes [provider]  aspirin 325 MG tablet Take 325 mg by mouth daily.    Yes [provider]  atorvastatin (LIPITOR) 10 MG tablet Take 10 mg by mouth daily.    Yes [provider]  B Complex Vitamins (VITAMIN-B COMPLEX) TABS Take 1 tablet by mouth daily.   Yes [provider]  bimatoprost (LUMIGAN) 0.03 % ophthalmic solution Place 1 drop into both eyes at bedtime.    Yes [provider]  camphor-menthol Wynelle Fanny) lotion Apply 1 application topically daily as needed for itching. Apply to affected area   Yes  [provider]  Cranberry-Vitamin C-Vitamin E (CRANBERRY PLUS VITAMIN C) 4200-20-3 MG-MG-UNIT CAPS Take 1 capsule by mouth daily.   Yes [provider]  docusate sodium (COLACE) 100 MG capsule Take 100 mg by mouth daily.   Yes [provider]  dorzolamide-timolol (COSOPT) 22.3-6.8 MG/ML ophthalmic solution Place 1 drop into both eyes 2 (two) times daily.    Yes [provider]  furosemide (LASIX) 20 MG tablet Take 30 mg by mouth  See admin instructions. Take (1.5 tabs)  once a day for 5 days a week on Sun, Mon, Wed, Thu, Sat   Yes [provider]  furosemide (LASIX) 20 MG tablet Take 30 mg by mouth daily.    Yes [provider]  Ginkgo Biloba 60 MG CAPS Take 1 capsule by mouth daily.   Yes [provider]  magnesium hydroxide (MILK OF MAGNESIA) 400 MG/5ML suspension Take 30 mLs by mouth every 4 (four) hours as needed for mild constipation. If constipation/ no BM for 2 days   Yes [provider]  magnesium oxide (MAG-OX) 400 MG tablet Take 200 mg by mouth daily.    Yes [provider]  METHYLSULFONYLMETHANE PO Take 900 mg by mouth daily.   Yes [provider]  Multiple Vitamins-Minerals (CENTRUM SILVER) tablet Take 1 tablet by mouth daily.   Yes [provider]  OXYGEN Inhale 2 L into the lungs continuous.   Yes [provider]  traMADol (ULTRAM) 50 MG tablet Take 25 mg by mouth at bedtime. 1/2 tablet   Yes [provider]  traMADol (ULTRAM) 50 MG tablet Take 50 mg by mouth every 6 (six) hours as needed.   Yes [provider]  vitamin B-12 (CYANOCOBALAMIN) 100 MCG tablet Take 100 mcg by mouth daily.   Yes [provider]  Zinc Sulfate (ZINC-220 PO) Take 1 tablet by mouth daily.   Yes [provider]  calcium carbonate (OS-CAL - DOSED IN MG OF ELEMENTAL CALCIUM) 1250 (500 Ca) MG tablet Take 1 tablet by mouth 2 (two) times daily.    [provider]  Probiotic Product (RISA-BID PROBIOTIC) TABS Take 1 tablet by mouth daily.    [provider]  Vitamin E 400 units TABS Take 1 tablet by mouth daily.    [provider]    Allergies Bee venom; Chocolate; Nsaids; and Vicodin [hydrocodone-acetaminophen]  Family History  Problem Relation Age of Onset  . Osteoporosis Mother   . Rheum arthritis Mother   . Stroke Mother   . Cancer Father        stomach and rectal  . Osteoporosis Sister   .  Osteoporosis Sister     Social History Social History   Tobacco Use  . Smoking status: Never Smoker  . Smokeless tobacco: Never Used  Substance Use Topics  . Alcohol use: No  . Drug use: No    Review of Systems Constitutional: No fever/chills Eyes: No visual changes. ENT: No sore throat. Cardiovascular: Denies chest pain. Respiratory: Denies shortness of breath. Gastrointestinal: Positive for abdominal pain vomiting and diarrhea  genitourinary: Negative for dysuria. Musculoskeletal: Negative for neck pain.  Negative for back pain. Integumentary: Negative for rash. Neurological: Negative for headaches, focal weakness or numbness.   ____________________________________________   PHYSICAL EXAM:  VITAL SIGNS: ED Triage Vitals  Enc Vitals Group     BP 12/27/17 2344 122/88     Pulse Rate 12/27/17 2344 90     Resp 12/27/17 2344 18  Temp 12/27/17 2344 98.8 F (37.1 C)     Temp Source 12/27/17 2344 Oral     SpO2 12/27/17 2344 98 %     Weight 12/27/17 2328 87.1 kg (192 lb)     Height --      Head Circumference --      Peak Flow --      Pain Score --      Pain Loc --      Pain Edu? --      Excl. in GC? --     Constitutional: Somnolent but arousable to verbal stimuli.  Fecal soiling Eyes: Conjunctivae are normal. PERRL. EOMI. Head: Atraumatic. Mouth/Throat: Mucous membranes are moist. Oropharynx non-erythematous. Neck: No stridor.   Cardiovascular: Normal rate, regular rhythm. Good peripheral circulation. Grossly normal heart sounds. Respiratory: Normal respiratory effort.  No retractions. Lungs CTAB. Gastrointestinal: Left upper quadrant/left lower quadrant tenderness to palpation. No distention.  Musculoskeletal: No lower extremity tenderness nor edema. No gross deformities of extremities. Neurologic:  Normal speech and language. No gross focal neurologic deficits are appreciated.  Skin: Skin is warm, dry and intact. No rash noted. Psychiatric: Mood and affect  are normal. Speech and behavior are normal.  ____________________________________________   LABS (all labs ordered are listed, but only abnormal results are displayed)  Labs Reviewed  GASTROINTESTINAL PANEL BY PCR, STOOL (REPLACES STOOL CULTURE) - Abnormal; Notable for the following components:      Result Value   Norovirus GI/GII DETECTED (*)    All other components within normal limits  CBC - Abnormal; Notable for the following components:   WBC 17.9 (*)    RBC 5.23 (*)    HCT 47.2 (*)    All other components within normal limits  COMPREHENSIVE METABOLIC PANEL - Abnormal; Notable for the following components:   Glucose, Bld 176 (*)    Calcium 8.6 (*)    Albumin 3.4 (*)    All other components within normal limits  C DIFFICILE QUICK SCREEN W PCR REFLEX   ____________________________________________  EKG  ED ECG REPORT I, [place] N Demone Lyles, the attending physician, personally viewed and interpreted this ECG.   Date: 12/28/2017  EKG Time: 11:42 PM  Rate: 91  Rhythm: Sinus rhythm with prolonged PR  Axis: Normal  Intervals: Prolonged PR interval  ST&T Change: None  ____________________________________________  RADIOLOGY I, [place] N Taiki Buckwalter, personally viewed and evaluated these images (plain radiographs) as part of my medical decision making, as well as reviewing the written report by the radiologist.    Official radiology report(s): Ct Abdomen Pelvis W Contrast  Result Date: 12/28/2017 CLINICAL DATA:  Initial evaluation for generalized abdominal pain, nausea, vomiting. EXAM: CT ABDOMEN AND PELVIS WITH CONTRAST TECHNIQUE: Multidetector CT imaging of the abdomen and pelvis was performed using the standard protocol following bolus administration of intravenous contrast. CONTRAST:  ISOVUE-300 IOPAMIDOL (ISOVUE-300) INJECTION 61% COMPARISON:  Prior CT from 07/08/2017. FINDINGS: Lower chest: Scattered atelectatic changes seen dependently within the visualized lung  bases. Visualized lungs are otherwise clear. Mild cardiomegaly noted. Hepatobiliary: Liver demonstrates a normal contrast enhanced appearance. Gallbladder surgically absent. Mild prominence of the common bile duct consistent with post cholecystectomy changes and age. Pancreas: Pancreas within normal limits. Spleen: Spleen within normal limits. Adrenals/Urinary Tract: Adrenal glands are normal. Kidneys equal size with symmetric enhancement. No nephrolithiasis, hydronephrosis, or focal enhancing renal mass. No hydroureter. Partially distended bladder within normal limits. Mild circumferential bladder wall thickening most like related incomplete distension. Stomach/Bowel: Fluid density present within the distal  esophageal lumen, which may reflect sequelae of reflux disease. Stomach within normal limits. No evidence for bowel obstruction. Few mildly prominent fluid-filled loops of bowel seen scattered throughout the lower abdomen, nonspecific, but can be seen in the setting of an acute enteritis. Appendix visualized within the right abdomen and is normal without evidence for acute appendicitis. Rectal vault is somewhat patulous with mild mucosal enhancement and intraluminal fluid density, which could reflect sequelae of an acute diarrheal illness. Mild stranding within the adjacent perirectal fat and presacral space. No other acute inflammatory changes seen about the bowels. Vascular/Lymphatic: Moderate aorto bi-iliac atherosclerotic disease. Normal intravascular enhancement seen throughout the intra-abdominal aorta. No aneurysm. Mesenteric vessels are patent proximally. No adenopathy seen within the abdomen and pelvis. Reproductive: 1 cm calcification within the uterus suspected to reflect a calcified uterine fibroid. Uterus otherwise unremarkable. Ovaries within normal limits. Other: No free air. No other free fluid or loculated fluid collection. Musculoskeletal: No acute osseous abnormality. Remotely healed right  inferior pubic ramus fracture noted. Extensive degenerative changes noted about the hips bilaterally. 4.6 x 4.9 cm collection at the anterior aspect of the proximal right femur noted, indeterminate, but likely degenerative (series 2, image 80). No associated inflammatory changes. Extensive changes related to calcific tendinopathy noted at the ischial tuberosities bilaterally. Advanced multilevel degenerative changes noted within the visualized spine. No discrete or worrisome osseous lesions. Diffuse osteopenia. Small focus of fat necrosis noted within subcutaneous fat of the left flank. IMPRESSION: 1. Mild mucosal enhancement with adjacent hazy fat stranding in intraluminal fluid density within the distal colon/rectum, suggesting possible acute diarrheal illness/mild colitis. 2. Few scattered mildly prominent fluid-filled loops of small bowel within the lower abdomen, nonspecific, but could reflect an associated enteritis given patient's symptoms. No evidence for obstruction. 3. No other acute intra-abdominal or pelvic process identified. 4. 4.6 cm fluid collection at the proximal right femur, nonspecific, but suspected to be degenerative in nature. Correlation with physical exam recommended. 5. Moderate aorto bi-iliac atherosclerotic disease. Electronically Signed   By: Rise Mu M.D.   On: 12/28/2017 01:27    Procedures   ____________________________________________   INITIAL IMPRESSION / ASSESSMENT AND PLAN / ED COURSE  As part of my medical decision making, I reviewed the following data within the electronic MEDICAL RECORD NUMBER   84 year old female present with above-stated history and physical exam secondary to diarrhea abdominal pain and vomiting.  Suspect infectious etiology as the source of the patient's symptoms however considered possibly diverticulitis, bowel obstruction with distal runoff.  A stat CT scan of the abdomen was performed which revealed findings consistent with colitis.   Stool cultures were obtained which is positive for Norovirus as well as a leukocytosis of 17.9.  Patient received IV Zofran 4 mg as well as IV normal saline 500 mL's in the emergency department.  Patient discussed with Dr. Anne Hahn for hospital admission for further evaluation and management ____________________________________________  FINAL CLINICAL IMPRESSION(S) / ED DIAGNOSES  Final diagnoses:  Enteritis due to Norovirus     MEDICATIONS GIVEN DURING THIS VISIT:  Medications  ondansetron (ZOFRAN) 4 MG/2ML injection (4 mg  Given 12/28/17 0016)  iopamidol (ISOVUE-300) 61 % injection 100 mL (100 mLs Intravenous Contrast Given 12/28/17 0037)  sodium chloride 0.9 % bolus 500 mL (500 mLs Intravenous New Bag/Given 12/28/17 0354)     ED Discharge Orders    None       Note:  This document was prepared using Dragon voice recognition software and may include unintentional dictation errors.  Darci CurrentBrown, [place] N, MD 12/28/17 [phone]

## 2017-12-28 ENCOUNTER — Emergency Department: Payer: Medicare Other

## 2017-12-28 ENCOUNTER — Other Ambulatory Visit: Payer: Self-pay

## 2017-12-28 ENCOUNTER — Encounter: Payer: Self-pay | Admitting: Radiology

## 2017-12-28 DIAGNOSIS — K529 Noninfective gastroenteritis and colitis, unspecified: Secondary | ICD-10-CM | POA: Diagnosis present

## 2017-12-28 DIAGNOSIS — L8990 Pressure ulcer of unspecified site, unspecified stage: Secondary | ICD-10-CM

## 2017-12-28 LAB — C DIFFICILE QUICK SCREEN W PCR REFLEX
C DIFFICILE (CDIFF) INTERP: NOT DETECTED
C Diff antigen: NEGATIVE
C Diff toxin: NEGATIVE

## 2017-12-28 LAB — CBC
HCT: 47.2 % — ABNORMAL HIGH (ref 35.0–47.0)
Hemoglobin: 15.5 g/dL (ref 12.0–16.0)
MCH: 29.6 pg (ref 26.0–34.0)
MCHC: 32.8 g/dL (ref 32.0–36.0)
MCV: 90.2 fL (ref 80.0–100.0)
PLATELETS: 251 10*3/uL (ref 150–440)
RBC: 5.23 MIL/uL — ABNORMAL HIGH (ref 3.80–5.20)
RDW: 12.8 % (ref 11.5–14.5)
WBC: 17.9 10*3/uL — ABNORMAL HIGH (ref 3.6–11.0)

## 2017-12-28 LAB — COMPREHENSIVE METABOLIC PANEL
ALT: 24 U/L (ref 14–54)
ANION GAP: 12 (ref 5–15)
AST: 30 U/L (ref 15–41)
Albumin: 3.4 g/dL — ABNORMAL LOW (ref 3.5–5.0)
Alkaline Phosphatase: 73 U/L (ref 38–126)
BUN: 17 mg/dL (ref 6–20)
CHLORIDE: 101 mmol/L (ref 101–111)
CO2: 26 mmol/L (ref 22–32)
Calcium: 8.6 mg/dL — ABNORMAL LOW (ref 8.9–10.3)
Creatinine, Ser: 0.51 mg/dL (ref 0.44–1.00)
Glucose, Bld: 176 mg/dL — ABNORMAL HIGH (ref 65–99)
Potassium: 4.3 mmol/L (ref 3.5–5.1)
Sodium: 139 mmol/L (ref 135–145)
Total Bilirubin: 1 mg/dL (ref 0.3–1.2)
Total Protein: 7.2 g/dL (ref 6.5–8.1)

## 2017-12-28 LAB — GASTROINTESTINAL PANEL BY PCR, STOOL (REPLACES STOOL CULTURE)
Adenovirus F40/41: NOT DETECTED
Astrovirus: NOT DETECTED
CAMPYLOBACTER SPECIES: NOT DETECTED
CYCLOSPORA CAYETANENSIS: NOT DETECTED
Cryptosporidium: NOT DETECTED
ENTAMOEBA HISTOLYTICA: NOT DETECTED
ENTEROTOXIGENIC E COLI (ETEC): NOT DETECTED
Enteroaggregative E coli (EAEC): NOT DETECTED
Enteropathogenic E coli (EPEC): NOT DETECTED
Giardia lamblia: NOT DETECTED
NOROVIRUS GI/GII: DETECTED — AB
PLESIMONAS SHIGELLOIDES: NOT DETECTED
Rotavirus A: NOT DETECTED
SALMONELLA SPECIES: NOT DETECTED
SAPOVIRUS (I, II, IV, AND V): NOT DETECTED
SHIGA LIKE TOXIN PRODUCING E COLI (STEC): NOT DETECTED
SHIGELLA/ENTEROINVASIVE E COLI (EIEC): NOT DETECTED
VIBRIO CHOLERAE: NOT DETECTED
Vibrio species: NOT DETECTED
Yersinia enterocolitica: NOT DETECTED

## 2017-12-28 LAB — TSH: TSH: 2.101 u[IU]/mL (ref 0.350–4.500)

## 2017-12-28 LAB — MRSA PCR SCREENING: MRSA by PCR: NEGATIVE

## 2017-12-28 LAB — GLUCOSE, CAPILLARY: Glucose-Capillary: 132 mg/dL — ABNORMAL HIGH (ref 65–99)

## 2017-12-28 MED ORDER — ATORVASTATIN CALCIUM 20 MG PO TABS
10.0000 mg | ORAL_TABLET | Freq: Every day | ORAL | Status: DC
  Administered 2017-12-28 – 2017-12-30 (×3): 10 mg via ORAL
  Filled 2017-12-28 (×3): qty 1

## 2017-12-28 MED ORDER — MAGNESIUM OXIDE 400 (241.3 MG) MG PO TABS
200.0000 mg | ORAL_TABLET | Freq: Every day | ORAL | Status: DC
  Filled 2017-12-28: qty 1

## 2017-12-28 MED ORDER — VITAMIN C 500 MG PO TABS
250.0000 mg | ORAL_TABLET | Freq: Every day | ORAL | Status: DC
  Administered 2017-12-28 – 2017-12-30 (×3): 250 mg via ORAL
  Filled 2017-12-28 (×2): qty 0.5
  Filled 2017-12-28: qty 1

## 2017-12-28 MED ORDER — ASPIRIN 325 MG PO TABS
325.0000 mg | ORAL_TABLET | Freq: Every day | ORAL | Status: DC
  Administered 2017-12-28 – 2017-12-31 (×4): 325 mg via ORAL
  Filled 2017-12-28 (×4): qty 1

## 2017-12-28 MED ORDER — RISAQUAD PO CAPS
1.0000 | ORAL_CAPSULE | Freq: Every day | ORAL | Status: DC
  Administered 2017-12-28 – 2017-12-31 (×4): 1 via ORAL
  Filled 2017-12-28 (×4): qty 1

## 2017-12-28 MED ORDER — ONDANSETRON HCL 4 MG/2ML IJ SOLN
4.0000 mg | Freq: Four times a day (QID) | INTRAMUSCULAR | Status: DC | PRN
  Administered 2017-12-29: 4 mg via INTRAVENOUS
  Filled 2017-12-28: qty 2

## 2017-12-28 MED ORDER — VITAMIN C 500 MG PO TABS
1000.0000 mg | ORAL_TABLET | Freq: Two times a day (BID) | ORAL | Status: DC
  Filled 2017-12-28 (×2): qty 2

## 2017-12-28 MED ORDER — ORAL CARE MOUTH RINSE
15.0000 mL | Freq: Two times a day (BID) | OROMUCOSAL | Status: DC
  Administered 2017-12-28 – 2017-12-31 (×7): 15 mL via OROMUCOSAL

## 2017-12-28 MED ORDER — IOPAMIDOL (ISOVUE-300) INJECTION 61%
100.0000 mL | Freq: Once | INTRAVENOUS | Status: AC | PRN
  Administered 2017-12-28: 100 mL via INTRAVENOUS

## 2017-12-28 MED ORDER — SODIUM CHLORIDE 0.9 % IV SOLN
INTRAVENOUS | Status: DC
  Administered 2017-12-28 – 2017-12-29 (×4): via INTRAVENOUS

## 2017-12-28 MED ORDER — MAGNESIUM HYDROXIDE 400 MG/5ML PO SUSP: 30.0000 mL | ORAL | Status: DC | PRN

## 2017-12-28 MED ORDER — ONDANSETRON HCL 4 MG/2ML IJ SOLN
INTRAMUSCULAR | Status: AC
  Administered 2017-12-28: 4 mg
  Filled 2017-12-28: qty 2

## 2017-12-28 MED ORDER — ENSURE ENLIVE PO LIQD
237.0000 mL | Freq: Two times a day (BID) | ORAL | Status: DC
  Administered 2017-12-29 – 2017-12-31 (×4): 237 mL via ORAL

## 2017-12-28 MED ORDER — FUROSEMIDE 20 MG PO TABS
30.0000 mg | ORAL_TABLET | Freq: Every day | ORAL | Status: DC
  Administered 2017-12-28 – 2017-12-31 (×4): 30 mg via ORAL
  Filled 2017-12-28 (×4): qty 2

## 2017-12-28 MED ORDER — B COMPLEX-C PO TABS
1.0000 | ORAL_TABLET | Freq: Every day | ORAL | Status: DC
  Administered 2017-12-28 – 2017-12-31 (×4): 1 via ORAL
  Filled 2017-12-28 (×4): qty 1

## 2017-12-28 MED ORDER — ZINC SULFATE 220 (50 ZN) MG PO CAPS
220.0000 mg | ORAL_CAPSULE | Freq: Every day | ORAL | Status: DC
  Filled 2017-12-28: qty 1

## 2017-12-28 MED ORDER — ZINC SULFATE 220 (50 ZN) MG PO CAPS: 220.0000 mg | ORAL_CAPSULE | Freq: Every day | ORAL | Status: DC

## 2017-12-28 MED ORDER — VITAMIN E 180 MG (400 UNIT) PO CAPS
400.0000 [IU] | ORAL_CAPSULE | Freq: Every day | ORAL | Status: DC
  Filled 2017-12-28: qty 1

## 2017-12-28 MED ORDER — MAGNESIUM OXIDE 400 (241.3 MG) MG PO TABS
200.0000 mg | ORAL_TABLET | Freq: Every day | ORAL | Status: DC
  Administered 2017-12-28 – 2017-12-30 (×3): 200 mg via ORAL
  Filled 2017-12-28 (×3): qty 1

## 2017-12-28 MED ORDER — DORZOLAMIDE HCL-TIMOLOL MAL 2-0.5 % OP SOLN
1.0000 [drp] | Freq: Two times a day (BID) | OPHTHALMIC | Status: DC
  Administered 2017-12-28 – 2017-12-31 (×7): 1 [drp] via OPHTHALMIC
  Filled 2017-12-28: qty 10

## 2017-12-28 MED ORDER — TRAMADOL HCL 50 MG PO TABS
25.0000 mg | ORAL_TABLET | Freq: Every day | ORAL | Status: DC
  Administered 2017-12-28 – 2017-12-29 (×2): 25 mg via ORAL
  Filled 2017-12-28 (×2): qty 1

## 2017-12-28 MED ORDER — ACETAMINOPHEN 325 MG PO TABS
650.0000 mg | ORAL_TABLET | Freq: Four times a day (QID) | ORAL | Status: DC | PRN
  Administered 2017-12-30 – 2017-12-31 (×2): 650 mg via ORAL
  Filled 2017-12-28 (×2): qty 2

## 2017-12-28 MED ORDER — TRAMADOL HCL 50 MG PO TABS: 50.0000 mg | ORAL_TABLET | Freq: Four times a day (QID) | ORAL | Status: DC | PRN

## 2017-12-28 MED ORDER — VITAMIN E 180 MG (400 UNIT) PO CAPS: 400.0000 [IU] | ORAL_CAPSULE | Freq: Every day | ORAL | Status: DC

## 2017-12-28 MED ORDER — CALCIUM CARBONATE ANTACID 500 MG PO CHEW
500.0000 mg | CHEWABLE_TABLET | Freq: Two times a day (BID) | ORAL | Status: DC
  Administered 2017-12-28 – 2017-12-31 (×7): 500 mg via ORAL
  Filled 2017-12-28 (×7): qty 3

## 2017-12-28 MED ORDER — CRANBERRY-VITAMIN C-VITAMIN E 4200-20-3 MG-MG-UNIT PO CAPS: 1.0000 | ORAL_CAPSULE | Freq: Every day | ORAL | Status: DC

## 2017-12-28 MED ORDER — METHYLSULFONYLMETHANE 1000 MG PO CAPS: 900.0000 mg | ORAL_CAPSULE | Freq: Every day | ORAL | Status: DC

## 2017-12-28 MED ORDER — ENOXAPARIN SODIUM 40 MG/0.4ML ~~LOC~~ SOLN
40.0000 mg | SUBCUTANEOUS | Status: DC
  Administered 2017-12-28 – 2017-12-30 (×3): 40 mg via SUBCUTANEOUS
  Filled 2017-12-28 (×3): qty 0.4

## 2017-12-28 MED ORDER — OCUVITE-LUTEIN PO CAPS
1.0000 | ORAL_CAPSULE | Freq: Every day | ORAL | Status: DC
  Administered 2017-12-29 – 2017-12-31 (×3): 1 via ORAL
  Filled 2017-12-28 (×3): qty 1

## 2017-12-28 MED ORDER — LATANOPROST 0.005 % OP SOLN
1.0000 [drp] | Freq: Every day | OPHTHALMIC | Status: DC
  Administered 2017-12-28 – 2017-12-30 (×3): 1 [drp] via OPHTHALMIC
  Filled 2017-12-28: qty 2.5

## 2017-12-28 MED ORDER — ADULT MULTIVITAMIN W/MINERALS CH
1.0000 | ORAL_TABLET | Freq: Every day | ORAL | Status: DC
  Administered 2017-12-29 – 2017-12-31 (×3): 1 via ORAL
  Filled 2017-12-28 (×4): qty 1

## 2017-12-28 MED ORDER — GINKGO BILOBA 60 MG PO CAPS: 1.0000 | ORAL_CAPSULE | Freq: Every day | ORAL | Status: DC

## 2017-12-28 MED ORDER — CALCIUM CARBONATE 1250 (500 CA) MG PO TABS: 1.0000 | ORAL_TABLET | Freq: Two times a day (BID) | ORAL | Status: DC

## 2017-12-28 MED ORDER — SODIUM CHLORIDE 0.9 % IV BOLUS (SEPSIS)
500.0000 mL | Freq: Once | INTRAVENOUS | Status: AC
  Administered 2017-12-28: 500 mL via INTRAVENOUS

## 2017-12-28 MED ORDER — VITAMIN B-12 100 MCG PO TABS
100.0000 ug | ORAL_TABLET | Freq: Every day | ORAL | Status: DC
  Administered 2017-12-29 – 2017-12-31 (×3): 100 ug via ORAL
  Filled 2017-12-28 (×4): qty 1

## 2017-12-28 MED ORDER — VITAMIN-B COMPLEX PO TABS: 1.0000 | ORAL_TABLET | Freq: Every day | ORAL | Status: DC

## 2017-12-28 MED ORDER — ATORVASTATIN CALCIUM 20 MG PO TABS
10.0000 mg | ORAL_TABLET | Freq: Every day | ORAL | Status: DC
Start: 2017-12-28 — End: 2017-12-28
  Filled 2017-12-28: qty 1

## 2017-12-28 MED ORDER — ONDANSETRON HCL 4 MG PO TABS: 4.0000 mg | ORAL_TABLET | Freq: Four times a day (QID) | ORAL | Status: DC | PRN

## 2017-12-28 MED ORDER — CAMPHOR-MENTHOL 0.5-0.5 % EX LOTN
1.0000 "application " | TOPICAL_LOTION | Freq: Every day | CUTANEOUS | Status: DC | PRN
  Filled 2017-12-28: qty 222

## 2017-12-28 MED ORDER — ACETAMINOPHEN 650 MG RE SUPP: 650.0000 mg | Freq: Four times a day (QID) | RECTAL | Status: DC | PRN

## 2017-12-28 MED ORDER — FUROSEMIDE 20 MG PO TABS: 30.0000 mg | ORAL_TABLET | ORAL | Status: DC

## 2017-12-28 NOTE — ED Notes (Signed)
Pt emesis episode x1. See MAR for intervention.

## 2017-12-28 NOTE — ED Notes (Signed)
Patient transported to CT 

## 2017-12-28 NOTE — Progress Notes (Addendum)
Initial Nutrition Assessment  DOCUMENTATION CODES:   Not applicable  INTERVENTION:   Pt will need Dysphagia 3 diet when advanced   Ensure Enlive po BID, each supplement provides 350 kcal and 20 grams of protein  MVI daily   Vitamin C 250mg  daily   Ocuvite daily for wound healing (provides zinc, vitamin A, vitamin C, Vitamin E, copper, and selenium)  Magic cup TID with meals, each supplement provides 290 kcal and 9 grams of protein  Assist with meals   NUTRITION DIAGNOSIS:   Inadequate oral intake related to acute illness as evidenced by meal completion < 25%.  GOAL:   Patient will meet greater than or equal to 90% of their needs  MONITOR:   PO intake, Supplement acceptance, Weight trends, TF tolerance, I & O's, Labs  REASON FOR ASSESSMENT:   Malnutrition Screening Tool    ASSESSMENT:   84 y/o female with past medical history of glaucoma, pelvic fracture last year, history of stroke and generalized weakness presents emergency department from LTAC with diarrhea and reported fever.  Patient is also had a few episodes of nonbloody nonbilious emesis.     Visited pt's room today. Unable to speak with pt r/t dementia so history obtained from pt's daughter at bedside. Pt's daughter reports that she has been living with pt and taking care of her for ~20 years. Pt fell last year and fractured her pelvis; fracture was medically managed and pt continued to live with daughter until daughter suffered a shoulder injury and was unable to lift her mother anymore. At that time pt was placed in an LTAC facility where she has since developed a pressure injury and lost  ~10lbs. Per daughter, pt is a good eater at baseline. Pt does require assistance with meals and needs chopped meats. Pt will pick up some foods and eat them with her fingers. Pt will need dysphagia 3 diet when advanced past liquids. Pt loves eggs, bacon, and toast. Pt does not drink supplements at home but pt's daughter had  recently considered buying some Ensure for her. Pt was able to eat some jello today; pt advanced to full liquids this evening. Per chart, pt has lost ~10lbs over the past 6 months; unsure of pt's true weight today as pt with severe edema. Pt's daughter has patient on a "home" regimen that includes many vitamin and herbs including vitamin C 1000mg  BID, B12 daily, B50? daily, cranberry tablet daily, garlic 100mg  tablet daily, vitamin E 400IU daily, Mg 200mg  daily, and a MVI. RD discussed with pt and made vitamin recommendations including adding Ocuvite and stopping zinc, vitamin E, and reducing vitamin C to 250mg  daily as Ocuvite has vitamin E, vitamin C, zinc, and copper in it. RD also recommended that daughter be cautious with herbs as a lot of these can have detrimental effects on the absorption of other meds. RD will add supplements and change vitamin regimen to best benefit pt. RD will monitor for diet advancement.     Medications reviewed and include: risaquad, aspirin, B-complex with C, tums, lovenox, lasix, MVI, tramadol, B12, vit C, Vit E, zinc  Labs reviewed: Ca 8.6(L) adj. 9.08 wnl, alb 3.4(L)- 3/20 Wbc- 17.9(H)- 3/20 NaCl @125ml /hr  Nutrition-Focused physical exam completed. Findings are no fat depletion, no muscle depletion, and severe edema BLE.   Diet Order:  Diet full liquid Room service appropriate? Yes; Fluid consistency: Thin  EDUCATION NEEDS:   Education needs have been addressed(with pt's daughter )  Skin:  Skin Assessment: (  Stage II buttocks )  Last BM:  3/21- type 7  Height:   Ht Readings from Last 1 Encounters:  12/28/17 5\' 3"  (1.6 m)    Weight:   Wt Readings from Last 1 Encounters:  12/28/17 182 lb 5.8 oz (82.7 kg)    Ideal Body Weight:  52.3 kg  BMI:  Body mass index is 32.3 kg/m.  Estimated Nutritional Needs:   Kcal:  1400-1600kcal/day   Protein:  83-91g/day   Fluid:  >1.4L/day   Betsey Holidayasey Aeralyn Barna MS, RD, LDN Pager #[phone] After  Hours Pager: [phone]

## 2017-12-28 NOTE — Clinical Social Work Note (Signed)
Clinical Social Work Assessment  Patient Details  Name: Kristin Graham MRN: [id number] Date of Birth: 04/04/1916  Date of referral:  12/28/17               Reason for consult:  Other (Comment Required)(From [facility] )                Permission sought to share information with:  Chartered certified accountant granted to share information::  Yes, Verbal Permission Granted  Name::        Agency::     Relationship::     Contact Information:     Housing/Transportation Living arrangements for the past 2 months:  [place], [place] of Information:  Adult Children, Facility Patient Interpreter Needed:  None Criminal Activity/Legal Involvement Pertinent to Current Situation/Hospitalization:  No - Comment as needed Significant Relationships:  Adult Children Lives with:  Adult Children Do you feel safe going back to the place where you live?  Yes Need for family participation in patient care:  Yes (Comment)  Care giving concerns:  Patient has been at [facility] under private pay for 2 weeks. Patient was living in [place] with her daughter Marlowe Kays before going to [place].    Social Worker assessment / plan:  Holiday representative (CSW) received consult that patient is from [place] and daughter does not want her to go back there. Per [facility] admissions coordinator [facility] patient is under long term care and private pay. Per Liliane Channel patient can return when stable. CSW made Liliane Channel aware that patient is positive for the norovirus. CSW met with patient's daughter Marlowe Kays in the family waiting room. Per daughter she was patient's primary caregiver for 18 years. Per daughter patient is not able to walk and she can barley sit on the side of the bed. Daughter reported that she put patient in [place] under private pay. Daughter reported that she is not happy with the care at [facility] and would like for patient to go to a different facility. CSW explained  that it will be difficult to get patient into another facility because she has the norovirus and she does not walk at baseline and insurance will not pay for short term rehab. CSW explained to daughter that options are to return to [place] or go home. Daughter reported that she will take patient home however she would like to discuss it with her her significant other Josph Macho before making a final decision. Daughter requested a hoyer lift or sit to stand lift if she brings patient home. Daughter reported that she wants [facility] for home health. RN case manager aware of above. CSW will continue to follow and assist as needed.    Employment status:  Disabled (Comment on whether or not currently receiving Disability), Retired Nurse, adult PT Recommendations:  Not assessed at this time Information / Referral to community resources:  [place]  Patient/Family's Response to care:  Daughter prefers to take patient home.   Patient/Family's Understanding of and Emotional Response to Diagnosis, Current Treatment, and Prognosis:  Daughter was very pleasant and thanked CSW for assistance.   Emotional Assessment Appearance:  Appears stated age Attitude/Demeanor/Rapport:  Unable to Assess Affect (typically observed):  Unable to Assess Orientation:  Oriented to Self, Oriented to Place, Fluctuating Orientation (Suspected and/or reported Sundowners) Alcohol / Substance use:  Not Applicable Psych involvement (Current and /or in the community):  No (Comment)  Discharge Needs  Concerns to be addressed:  Discharge Planning Concerns Readmission within the last 30 days:  No Current discharge risk:  Dependent with Mobility, Cognitively Impaired, Chronically ill Barriers to Discharge:  Continued Medical Work up   [employer], Veronia Beets, LCSW 12/28/2017, 11:27 AM

## 2017-12-28 NOTE — H&P (Signed)
Kristin Graham is an 84 y.o. female.   Chief Complaint: Diarrhea HPI: The patient with past medical history of glaucoma, pelvic fracture last year, history of stroke and generalized weakness presents emergency department from [place] with diarrhea and reported fever.  Patient is also had a few episodes of nonbloody nonbilious emesis.  Symptoms began approximately 12 hours prior to admission.  The patient does not have any mental status change but does feel more generally weak.  In the emergency department the patient was found to be positive for neurovirus.  IV hydration was started prior to the emergency department staff called the hospitalist service for admission.  Past Medical History:  Diagnosis Date  . Bilateral carpal tunnel syndrome    S/P surgery  . Depression    unspecified  . Foot fracture, right   . Glaucoma   . Left leg weakness    Progressive, evaluated by Ortho. LS spine MRI with enhancing mass involving L2 vertebra. oncology evaluation with Dr. Oliva Bustard 6/11; bone scan negative; PET scan negative. 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 Neurosurgery consultation arranged by Oncology.  . Left shoulder tendinitis    with partial rotator cuff tear evaluated by Dr. Leanor Kail  . Leg edema    likely venous insufficiency. Declines compression hose. Declines physical therapy. Echo 3/10 with normal heart function, mild MR.  . Osteoarthritis   . Osteoporosis, post-menopausal   . Stroke ([place])    History of CVA versus TIA, 1996, with reported episodes of TIA since then. Left pontine CVA by MRI 12/14.  [name] Urinary incontinence   . Vision loss of left eye    followed by Dr. Tobe Sos. Possibly glaucoma-induced versus ischemic neuropathy from vascular disease.    Past Surgical History:  Procedure Laterality Date  . CARPAL TUNNEL RELEASE  2003  . CATARACT EXTRACTION, BILATERAL  2000  . CHOLECYSTECTOMY    . JOINT REPLACEMENT  07/2005   left  total knee, Dr. Leanor Kail    Family History  Problem Relation Age of Onset  . Osteoporosis Mother   . Rheum arthritis Mother   . Stroke Mother   . Cancer Father        stomach and rectal  . Osteoporosis Sister   . Osteoporosis Sister    Social History:  reports that she has never smoked. She has never used smokeless tobacco. She reports that she does not drink alcohol or use drugs.  Allergies:  Allergies  Allergen Reactions  . Bee Venom Swelling  . Chocolate   . Nsaids   . Vicodin [Hydrocodone-Acetaminophen]     Medications Prior to Admission  Medication Sig Dispense Refill  . acetaminophen (TYLENOL) 325 MG tablet Take 650 mg by mouth every 4 (four) hours as needed.    [name] acetaminophen (TYLENOL) 500 MG tablet Take 250 mg by mouth at bedtime. 1/2 tablet per home routine    . ascorbic acid (VITAMIN C) 1000 MG tablet Take 1,000 mg by mouth 2 (two) times daily.    [name] aspirin 325 MG tablet Take 325 mg by mouth daily.     [name] atorvastatin (LIPITOR) 10 MG tablet Take 10 mg by mouth daily.     . B Complex Vitamins (VITAMIN-B COMPLEX) TABS Take 1 tablet by mouth daily.    . bimatoprost (LUMIGAN) 0.03 % ophthalmic solution Place 1 drop into both eyes at bedtime.     . camphor-menthol (SARNA) lotion Apply 1 application topically daily as needed for itching.  Apply to affected area    . Cranberry-Vitamin C-Vitamin E (CRANBERRY PLUS VITAMIN C) 4200-20-3 MG-MG-UNIT CAPS Take 1 capsule by mouth daily.    [name] docusate sodium (COLACE) 100 MG capsule Take 100 mg by mouth daily.    . dorzolamide-timolol (COSOPT) 22.3-6.8 MG/ML ophthalmic solution Place 1 drop into both eyes 2 (two) times daily.     . furosemide (LASIX) 20 MG tablet Take 30 mg by mouth See admin instructions. Take (1.5 tabs)  once a day for 5 days a week on Sun, Mon, Wed, Thu, Sat    . furosemide (LASIX) 20 MG tablet Take 30 mg by mouth daily.     . Ginkgo Biloba 60 MG CAPS Take 1 capsule by mouth daily.    . magnesium hydroxide  (MILK OF MAGNESIA) 400 MG/5ML suspension Take 30 mLs by mouth every 4 (four) hours as needed for mild constipation. If constipation/ no BM for 2 days    . magnesium oxide (MAG-OX) 400 MG tablet Take 200 mg by mouth daily.     . METHYLSULFONYLMETHANE PO Take 900 mg by mouth daily.    . Multiple Vitamins-Minerals (CENTRUM SILVER) tablet Take 1 tablet by mouth daily.    . OXYGEN Inhale 2 L into the lungs continuous.    . traMADol (ULTRAM) 50 MG tablet Take 25 mg by mouth at bedtime. 1/2 tablet    . traMADol (ULTRAM) 50 MG tablet Take 50 mg by mouth every 6 (six) hours as needed.    . vitamin B-12 (CYANOCOBALAMIN) 100 MCG tablet Take 100 mcg by mouth daily.    . Zinc Sulfate (ZINC-220 PO) Take 1 tablet by mouth daily.    . calcium carbonate (OS-CAL - DOSED IN MG OF ELEMENTAL CALCIUM) 1250 (500 Ca) MG tablet Take 1 tablet by mouth 2 (two) times daily.    . Probiotic Product (RISA-BID PROBIOTIC) TABS Take 1 tablet by mouth daily.    . Vitamin E 400 units TABS Take 1 tablet by mouth daily.      Results for orders placed or performed during the hospital encounter of 12/27/17 (from the past 48 hour(s))  Gastrointestinal Panel by PCR , Stool     Status: Abnormal   Collection Time: 12/27/17 11:41 PM  Result Value Ref Range   Campylobacter species NOT DETECTED NOT DETECTED   Plesimonas shigelloides NOT DETECTED NOT DETECTED   Salmonella species NOT DETECTED NOT DETECTED   Yersinia enterocolitica NOT DETECTED NOT DETECTED   Vibrio species NOT DETECTED NOT DETECTED   Vibrio cholerae NOT DETECTED NOT DETECTED   Enteroaggregative E coli (EAEC) NOT DETECTED NOT DETECTED   Enteropathogenic E coli (EPEC) NOT DETECTED NOT DETECTED   Enterotoxigenic E coli (ETEC) NOT DETECTED NOT DETECTED   Shiga like toxin producing E coli (STEC) NOT DETECTED NOT DETECTED   Shigella/Enteroinvasive E coli (EIEC) NOT DETECTED NOT DETECTED   Cryptosporidium NOT DETECTED NOT DETECTED   Cyclospora cayetanensis NOT DETECTED NOT  DETECTED   Entamoeba histolytica NOT DETECTED NOT DETECTED   Giardia lamblia NOT DETECTED NOT DETECTED   Adenovirus F40/41 NOT DETECTED NOT DETECTED   Astrovirus NOT DETECTED NOT DETECTED   Norovirus GI/GII DETECTED (A) NOT DETECTED    Comment: RESULT CALLED TO, READ BACK BY AND VERIFIED WITH: VANESSA ASHLEY @ 0134 ON 12/28/2017 BYCAF    Rotavirus A NOT DETECTED NOT DETECTED   Sapovirus (I, II, IV, and V) NOT DETECTED NOT DETECTED    Comment: Performed at [facility], [address]  [place], [place] [address]  CBC     Status: Abnormal   Collection Time: 12/27/17 11:58 PM  Result Value Ref Range   WBC 17.9 (H) 3.6 - 11.0 K/uL   RBC 5.23 (H) 3.80 - 5.20 MIL/uL   Hemoglobin 15.5 12.0 - 16.0 g/dL   HCT 47.2 (H) 35.0 - 47.0 %   MCV 90.2 80.0 - 100.0 fL   MCH 29.6 26.0 - 34.0 pg   MCHC 32.8 32.0 - 36.0 g/dL   RDW 12.8 11.5 - 14.5 %   Platelets 251 150 - 440 K/uL    Comment: Performed at [facility] [address] [place], [place] [address]  Comprehensive metabolic panel     Status: Abnormal   Collection Time: 12/27/17 11:58 PM  Result Value Ref Range   Sodium 139 135 - 145 mmol/L   Potassium 4.3 3.5 - 5.1 mmol/L   Chloride 101 101 - 111 mmol/L   CO2 26 22 - 32 mmol/L   Glucose, Bld 176 (H) 65 - 99 mg/dL   BUN 17 6 - 20 mg/dL   Creatinine, Ser 0.51 0.44 - 1.00 mg/dL   Calcium 8.6 (L) 8.9 - 10.3 mg/dL   Total Protein 7.2 6.5 - 8.1 g/dL   Albumin 3.4 (L) 3.5 - 5.0 g/dL   AST 30 15 - 41 U/L   ALT 24 14 - 54 U/L   Alkaline Phosphatase 73 38 - 126 U/L   Total Bilirubin 1.0 0.3 - 1.2 mg/dL   GFR calc non Af Amer >60 >60 mL/min   GFR calc Af Amer >60 >60 mL/min    Comment: (NOTE) The eGFR has been calculated using the CKD EPI equation. This calculation has not been validated in all clinical situations. eGFR's persistently <60 mL/min signify possible Chronic Kidney Disease.    Anion gap 12 5 - 15    Comment: Performed at [facility], [address] [place], [place] [address]  C difficile quick scan w PCR reflex     Status: None   Collection Time: 12/27/17 11:59 PM  Result Value Ref Range   C Diff antigen NEGATIVE NEGATIVE   C Diff toxin NEGATIVE NEGATIVE   C Diff interpretation No C. difficile detected.     Comment: VALID Performed at [facility] [address] [place], [place] [address]    Ct Abdomen Pelvis W Contrast  Result Date: 12/28/2017 CLINICAL DATA:  Initial evaluation for generalized abdominal pain, nausea, vomiting. EXAM: CT ABDOMEN AND PELVIS WITH CONTRAST TECHNIQUE: Multidetector CT imaging of the abdomen and pelvis was performed using the standard protocol following bolus administration of intravenous contrast. CONTRAST:  152m ISOVUE-300 IOPAMIDOL (ISOVUE-300) INJECTION 61% COMPARISON:  Prior CT from 07/08/2017. FINDINGS: Lower chest: Scattered atelectatic changes seen dependently within the visualized lung bases. Visualized lungs are otherwise clear. Mild cardiomegaly noted. Hepatobiliary: Liver demonstrates a normal contrast enhanced appearance. Gallbladder surgically absent. Mild prominence of the common bile duct consistent with post cholecystectomy changes and age. Pancreas: Pancreas within normal limits. Spleen: Spleen within normal limits. Adrenals/Urinary Tract: Adrenal glands are normal. Kidneys equal size with symmetric enhancement. No nephrolithiasis, hydronephrosis, or focal enhancing renal mass. No hydroureter. Partially distended bladder within normal limits. Mild circumferential bladder wall thickening most like related incomplete distension. Stomach/Bowel: Fluid density present within the distal esophageal lumen, which may reflect sequelae of reflux disease. Stomach within normal limits. No evidence for bowel obstruction. Few mildly prominent fluid-filled loops of bowel seen scattered throughout the lower abdomen, nonspecific,  but can be seen in the setting of an acute enteritis. Appendix visualized  within the right abdomen and is normal without evidence for acute appendicitis. Rectal vault is somewhat patulous with mild mucosal enhancement and intraluminal fluid density, which could reflect sequelae of an acute diarrheal illness. Mild stranding within the adjacent perirectal fat and presacral space. No other acute inflammatory changes seen about the bowels. Vascular/Lymphatic: Moderate aorto bi-iliac atherosclerotic disease. Normal intravascular enhancement seen throughout the intra-abdominal aorta. No aneurysm. Mesenteric vessels are patent proximally. No adenopathy seen within the abdomen and pelvis. Reproductive: 1 cm calcification within the uterus suspected to reflect a calcified uterine fibroid. Uterus otherwise unremarkable. Ovaries within normal limits. Other: No free air. No other free fluid or loculated fluid collection. Musculoskeletal: No acute osseous abnormality. Remotely healed right inferior pubic ramus fracture noted. Extensive degenerative changes noted about the hips bilaterally. 4.6 x 4.9 cm collection at the anterior aspect of the proximal right femur noted, indeterminate, but likely degenerative (series 2, image 80). No associated inflammatory changes. Extensive changes related to calcific tendinopathy noted at the ischial tuberosities bilaterally. Advanced multilevel degenerative changes noted within the visualized spine. No discrete or worrisome osseous lesions. Diffuse osteopenia. Small focus of fat necrosis noted within subcutaneous fat of the left flank. IMPRESSION: 1. Mild mucosal enhancement with adjacent hazy fat stranding in intraluminal fluid density within the distal colon/rectum, suggesting possible acute diarrheal illness/mild colitis. 2. Few scattered mildly prominent fluid-filled loops of small bowel within the lower abdomen, nonspecific, but could reflect an associated enteritis given patient's symptoms. No evidence for obstruction. 3. No other acute intra-abdominal or  pelvic process identified. 4. 4.6 cm fluid collection at the proximal right femur, nonspecific, but suspected to be degenerative in nature. Correlation with physical exam recommended. 5. Moderate aorto bi-iliac atherosclerotic disease. Electronically Signed   By: Jeannine Boga M.D.   On: 12/28/2017 01:27    Review of Systems  Unable to perform ROS: Age    Blood pressure 107/62, pulse 95, temperature 97.6 F (36.4 C), temperature source Oral, resp. rate 18, height _0  (1.6 m), weight 80.7 kg (178 lb), SpO2 100 %. Physical Exam  Vitals reviewed. Constitutional: She is oriented to person, place, and time. She appears well-developed and well-nourished. No distress.  HENT:  Head: Normocephalic and atraumatic.  Mouth/Throat: Oropharynx is clear and moist.  Eyes: Pupils are equal, round, and reactive to light. Conjunctivae and EOM are normal. No scleral icterus.  Neck: Normal range of motion. Neck supple. No JVD present. No tracheal deviation present. No thyromegaly present.  Cardiovascular: Normal rate, regular rhythm and normal heart sounds. Exam reveals no gallop and no friction rub.  No murmur heard. Respiratory: Effort normal and breath sounds normal.  GI: Soft. Bowel sounds are normal. She exhibits no distension. There is no tenderness.  Genitourinary:  Genitourinary Comments: Deferred  Musculoskeletal: Normal range of motion. She exhibits edema (right>left).  Lymphadenopathy:    She has no cervical adenopathy.  Neurological: She is alert and oriented to person, place, and time. No cranial nerve deficit. She exhibits normal muscle tone.  Skin: Skin is warm and dry. No rash noted. No erythema.  Psychiatric: She has a normal mood and affect. Her behavior is normal. Judgment and thought content normal.     Assessment/Plan This is a 84 year old female admitted for gastroenteritis. 1.  Gastroenteritis: Noroavirus.  The patient is afebrile.  She is very fatigued but will answer  questions appropriately.  She does not meet criteria for  sepsis at this time.  Continue to observe.  Advance diet as tolerated.  Hydrate with intravenous fluid until patient is consistently taking food by mouth. 2.  Venous insufficiency: The patient has swelling of her lower extremities.  She has a complicated Lasix regimen from home which we will verify with her pharmacy.  Continue diuretic therapy per home regimen. 3.  Glaucoma: Continue Cosopt 4.  History of hip fracture: Left; PT/OT while hospitalized to establish realistic goals for family who is very concerned about the patient's slow progress.  Exline 5.  Hyperlipidemia: Continue statin therapy 6.  DVT prophylaxis: Lovenox 7.  GI prophylaxis: None The patient is a full code.  Time spent on admission orders and patient care approximately 45 minutes.   Harrie Foreman, MD 12/28/2017, 6:21 AM

## 2017-12-28 NOTE — Plan of Care (Signed)
  Problem: Education: Goal: Knowledge of General Education information will improve Outcome: Progressing  Pt remains on Enteric Isolation d/t positive norovirus results Problem: Elimination: Goal: Will not experience complications related to bowel motility Outcome: Progressing  Pt incontinent of stool this shift; protective skin product applied Problem: Elimination: Goal: Will not experience complications related to urinary retention Outcome: Progressing  Pt incontinent of urine this shift; protective skin product applied Problem: Skin Integrity: Goal: Risk for impaired skin integrity will decrease Outcome: Progressing  MASD bil groin present on admission; pt's diaper changed PRN; protective skin product applied; will continue to monitor

## 2017-12-28 NOTE — Care Management (Signed)
Daughter has decided to take patient back home rather than snf.  [facility]. Junious DresserConnie says that patient has to be able stand to pivot.  it really sound as though patient has been bed bound for quite some time.  Junious DresserConnie says that taking continuous care of her mother has caused injury to her shoulder which needs rest and care but she is insistent on taking patient home.  Patient's son- [name]'s brother lives out of state.  Junious DresserConnie says that she is a para legal and is able to adjust her hours.  CSW relayed that [facility].  Junious DresserConnie may benefit form some continuous in home services.  Will provide written resource.  Services anticipated for home health SN PT Aide and SW.  Will speak with Junious Dresseronnie regarding need for dme.

## 2017-12-28 NOTE — Progress Notes (Signed)
PHARMACIST - PHYSICIAN ORDER COMMUNICATION  CONCERNING: P&T Medication Policy on Herbal Medications  DESCRIPTION:  This patient's order for:  Cranberry-vitamin C-vitamin E, Ginkgo biloba, methylsulfonylmethane  has been noted.  This product(s) is classified as an "herbal" or natural product. Due to a lack of definitive safety studies or FDA approval, nonstandard manufacturing practices, plus the potential risk of unknown drug-drug interactions while on inpatient medications, the Pharmacy and Therapeutics Committee does not permit the use of "herbal" or natural products of this type within [facility].   ACTION TAKEN: The pharmacy department is unable to verify this order at this time. Please reevaluate patient's clinical condition at discharge and address if the herbal or natural product(s) should be resumed at that time.

## 2017-12-28 NOTE — Care Management (Signed)
Spoke with patient's daughter Kristin LatherConnie Graham who has been providing care for patient for the past 18 years.  It is documented patient presented from SNF and attempts to confirm with daughter at times confusing.  Finally relays patient has been at [facility] getting physical therapy for the past 12 days.  Daughter is very focused on previous admission when patient had hip and pelvic fracture and why insurance "denied."  Much effort required to redirect patient to current circumstances.  Patient is placed in observation for nausea/vomiting and diarrhea w Noro Virus.  She does not want patient to return to that facility due to inadequate care.  Updated CSW who found that patient is rather at the facility under long term care. If daughter does not want patient to return to the facility may very likely decide to take patient back home.

## 2017-12-28 NOTE — Progress Notes (Signed)
Family Meeting Note  [facility] Directive:no  Today a meeting took place with the daughter.  Patient is unable to participate due [id number]:Lacked capacity dementia.   The following clinical team members were present during this meeting:MD  The following were discussed:Patient's diagnosis: dementia, weakness, decreased functional status. , Patient's progosis: < 12 months and Goals for treatment: Continue present management  I have explained about her very old age and possibility of her " never able to walk again, as for last 4-5 mths she is going down hill and not able to walk."  I have explained her about the resuccitation process in event of cardiac arrest, as daughter did not know that. I have encouraged her to reconsider her decision about " Full code" and let us know. She will discuss with her brother and let us know. Additional follow-up to be provided: PMD  Time spent during discussion:20 minutes  Altamese DillingVaibhavkumar Honest Safranek, MD

## 2017-12-28 NOTE — [place] FL2 (Signed)
[place] MEDICAID FL2 LEVEL OF CARE SCREENING TOOL     IDENTIFICATION  Patient Name: Kristin Graham Birthdate: 20-Dec-1915 Sex: female Admission Date (Current Location): 12/27/2017  [place] and [place] Number:  Chiropodist and Address:  [facility], [address] [place], [place] [address]      Provider Number: [id number]  Attending Physician Name and Address:  Altamese Dilling, *  Relative Name and Phone Number:       Current Level of Care: Hospital Recommended Level of Care: Skilled Nursing Facility Prior Approval Number:    Date Approved/Denied:   PASRR Number: ([id number] )  Discharge Plan: SNF    Current Diagnoses: Patient Active Problem List   Diagnosis Date Noted  . Gastroenteritis 12/28/2017  . Pressure injury of skin 12/28/2017  . Protein-calorie malnutrition (HCC) 07/26/2017  . Hip pain 07/09/2017  . Pelvic fracture (HCC) 07/08/2017  . Cataract 07/08/2017  . CHF (congestive heart failure) (HCC) 07/08/2017  . Dyslipidemia 07/08/2017  . TIA (transient ischemic attack) 07/08/2017    Orientation RESPIRATION BLADDER Height & Weight     Self, Place  O2(2 Liters Oxygen. ) Continent Weight: 178 lb (80.7 kg) Height:  5\' 3"  (160 cm)  BEHAVIORAL SYMPTOMS/MOOD NEUROLOGICAL BOWEL NUTRITION STATUS      Continent Diet(Diet: clear liquid to be advanced. )  AMBULATORY STATUS COMMUNICATION OF NEEDS Skin   Extensive Assist Verbally PU Stage and Appropriate Care(pressure ulcer on buttocks and sacrum. )                       Personal Care Assistance Level of Assistance  Bathing, Feeding, Dressing Bathing Assistance: Limited assistance Feeding assistance: Independent Dressing Assistance: Limited assistance     Functional Limitations Info  Sight, Hearing, Speech Sight Info: Adequate Hearing Info: Impaired Speech Info: Adequate    SPECIAL CARE FACTORS FREQUENCY                       Contractures       Additional Factors Info  Code Status, Allergies, Isolation Precautions Code Status Info: (Full Code. ) Allergies Info: (Bee Venom, Chocolate, Nsaids, Vicodin Hydrocodone-acetaminophen)     Isolation Precautions Info: (on isolation for the Norovirus )     Current Medications (12/28/2017):  This is the current hospital active medication list Current Facility-Administered Medications  Medication Dose Route Frequency Provider Last Rate Last Dose  . 0.9 %  sodium chloride infusion   Intravenous Continuous Arnaldo Natal, MD 125 mL/hr at 12/28/17 0532    . acetaminophen (TYLENOL) tablet 650 mg  650 mg Oral Q6H PRN Arnaldo Natal, MD       Or  . acetaminophen (TYLENOL) suppository 650 mg  650 mg Rectal Q6H PRN Arnaldo Natal, MD      . acidophilus (RISAQUAD) capsule 1 capsule  1 capsule Oral Daily Arnaldo Natal, MD      . aspirin tablet 325 mg  325 mg Oral Daily Arnaldo Natal, MD      . atorvastatin (LIPITOR) tablet 10 mg  10 mg Oral Daily Arnaldo Natal, MD      . B-complex with vitamin C tablet 1 tablet  1 tablet Oral Daily Arnaldo Natal, MD      . calcium carbonate (TUMS - dosed in mg elemental calcium) chewable tablet 500 mg of elemental calcium  500 mg of elemental calcium Oral BID Arnaldo Natal, MD      .  camphor-menthol (SARNA) lotion 1 application  1 application Topical Daily PRN Arnaldo Nataliamond, Michael S, MD      . dorzolamide-timolol (COSOPT) 22.3-6.8 MG/ML ophthalmic solution 1 drop  1 drop Both Eyes BID Arnaldo Nataliamond, Michael S, MD      . enoxaparin (LOVENOX) injection 40 mg  40 mg Subcutaneous Q24H Arnaldo Nataliamond, Michael S, MD      . furosemide (LASIX) tablet 30 mg  30 mg Oral Daily Arnaldo Nataliamond, Michael S, MD      . latanoprost (XALATAN) 0.005 % ophthalmic solution 1 drop  1 drop Both Eyes QHS Arnaldo Nataliamond, Michael S, MD      . magnesium hydroxide (MILK OF MAGNESIA) suspension 30 mL  30 mL Oral Q4H PRN Arnaldo Nataliamond, Michael S, MD      . magnesium oxide (MAG-OX) tablet 200 mg   200 mg Oral Daily Arnaldo Nataliamond, Michael S, MD      . MEDLINE mouth rinse  15 mL Mouth Rinse BID Arnaldo Nataliamond, Michael S, MD      . multivitamin with minerals tablet 1 tablet  1 tablet Oral Daily Arnaldo Nataliamond, Michael S, MD      . ondansetron [facility](ZOFRAN) tablet 4 mg  4 mg Oral Q6H PRN Arnaldo Nataliamond, Michael S, MD       Or  . ondansetron [facility](ZOFRAN) injection 4 mg  4 mg Intravenous Q6H PRN Arnaldo Nataliamond, Michael S, MD      . traMADol Janean Sark(ULTRAM) tablet 25 mg  25 mg Oral QHS Arnaldo Nataliamond, Michael S, MD      . traMADol Janean Sark(ULTRAM) tablet 50 mg  50 mg Oral Q6H PRN Arnaldo Nataliamond, Michael S, MD      . vitamin B-12 (CYANOCOBALAMIN) tablet 100 mcg  100 mcg Oral Daily Arnaldo Nataliamond, Michael S, MD      . vitamin C (ASCORBIC ACID) tablet 1,000 mg  1,000 mg Oral BID Arnaldo Nataliamond, Michael S, MD      . vitamin E capsule 400 Units  400 Units Oral Daily Arnaldo Nataliamond, Michael S, MD      . zinc sulfate capsule 220 mg  220 mg Oral Daily Arnaldo Nataliamond, Michael S, MD         Discharge Medications: Please see discharge summary for a list of discharge medications.  Relevant Imaging Results:  Relevant Lab Results:   Additional Information (SSN: [id number])  Ashtynn Berke, Darleen CrockerBailey M, LCSW

## 2017-12-28 NOTE — ED Notes (Signed)
Pts family notified of positive Noro Virus result per MD

## 2017-12-28 NOTE — Progress Notes (Signed)
Patient does not have CHF. Lasix is for venous insufficiency. Pharmacy trying to reconcile Lasix dosing from home meds. Continue maintenance fluid to offset GI loss. Reduce rate as po intake improves or if long-standing edema from venous insufficiency worsens.

## 2017-12-28 NOTE — Progress Notes (Signed)
Clinical Child psychotherapistocial Worker (CSW) received a call from patient's daughter Junious DresserConnie who stated that she has decided to take patient home with home health. RN case manager aware of above. Please reconsult if future social work needs arise. CSW signing off.   Baker Hughes IncorporatedBailey Niah Heinle, LCSW [phone]

## 2017-12-29 ENCOUNTER — Observation Stay: Payer: Medicare Other

## 2017-12-29 MED ORDER — FUROSEMIDE 10 MG/ML IJ SOLN
20.0000 mg | Freq: Once | INTRAMUSCULAR | Status: AC
  Administered 2017-12-30: 20 mg via INTRAVENOUS
  Filled 2017-12-29: qty 2

## 2017-12-29 MED ORDER — ONDANSETRON HCL 4 MG PO TABS: 4.0000 mg | ORAL_TABLET | Freq: Four times a day (QID) | ORAL | 0 refills | Status: DC | PRN

## 2017-12-29 MED ORDER — ENSURE ENLIVE PO LIQD: 237.0000 mL | Freq: Two times a day (BID) | ORAL | 12 refills | Status: DC

## 2017-12-29 NOTE — Progress Notes (Signed)
[facility] [place] [facility]   PATIENT NAME: Kristin Graham    MR#:  [id number]  DATE OF BIRTH:  1915/10/14  SUBJECTIVE:  CHIEF COMPLAINT:   Chief Complaint  Patient presents with  . Nausea  . Emesis  . Diarrhea    Brought from NH with diarrhea and vomit, Noted to have Norvo virus.  No vomit since admission, no diarrhea since early morning, tolerating diet.  plan was for discharge, but pt was noted to require oxygen, she don't have home oxygen.  REVIEW OF SYSTEMS:  Pt is not well oriented.  ROS  DRUG ALLERGIES:   Allergies  Allergen Reactions  . Bee Venom Swelling  . Chocolate   . Nsaids   . Vicodin [Hydrocodone-Acetaminophen]     VITALS:  Blood pressure 122/61, pulse 80, temperature 97.7 F (36.5 C), resp. rate 20, height 5\' 3"  (1.6 m), weight 85.1 kg (187 lb 11.2 oz), SpO2 100 %.  PHYSICAL EXAMINATION:  GENERAL:  84 y.o.-year-old patient lying in the bed with no acute distress.  EYES: Pupils equal, round, reactive to light and accommodation. No scleral icterus. Extraocular muscles intact.  HEENT: Head atraumatic, normocephalic. Oropharynx and nasopharynx clear.  NECK:  Supple, no jugular venous distention. No thyroid enlargement, no tenderness.  LUNGS: Normal breath sounds bilaterally, no wheezing, rales,rhonchi or crepitation. No use of accessory muscles of respiration.  CARDIOVASCULAR: S1, S2 normal. have murmurs .  ABDOMEN: Soft, nontender, nondistended. Bowel sounds present. No organomegaly or mass.  EXTREMITIES: No pedal edema, cyanosis, or clubbing.  NEUROLOGIC: Cranial nerves II through XII are intact. Muscle strength 3/5 in all extremities. Sensation intact. Gait not checked. Vision loss ( At baseline) PSYCHIATRIC: The patient is alert and oriented x 1.  SKIN: No obvious rash, lesion, or ulcer.   Physical Exam LABORATORY PANEL:   CBC Recent Labs  Lab 12/27/17 2358  WBC 17.9*  HGB 15.5  HCT 47.2*  PLT 251    ------------------------------------------------------------------------------------------------------------------  Chemistries  Recent Labs  Lab 12/27/17 2358  NA 139  K 4.3  CL 101  CO2 26  GLUCOSE 176*  BUN 17  CREATININE 0.51  CALCIUM 8.6*  AST 30  ALT 24  ALKPHOS 73  BILITOT 1.0   ------------------------------------------------------------------------------------------------------------------  Cardiac Enzymes No results for input(s): TROPONINI in the last 168 hours. ------------------------------------------------------------------------------------------------------------------  RADIOLOGY:  Dg Chest 1 View  Result Date: 12/29/2017 CLINICAL DATA:  Coarse crackles hypoxia EXAM: CHEST  1 VIEW COMPARISON:  07/08/2017 FINDINGS: Small pleural effusions. Bibasilar atelectasis or infiltrate. Borderline to mild cardiomegaly. Aortic atherosclerosis. No pneumothorax. IMPRESSION: 1. Small pleural effusions and bibasilar atelectasis or infiltrate 2. Stable borderline to mild cardiomegaly. Electronically Signed   By: Jasmine PangKim  Fujinaga M.D.   On: 12/29/2017 18:27   Ct Abdomen Pelvis W Contrast  Result Date: 12/28/2017 CLINICAL DATA:  Initial evaluation for generalized abdominal pain, nausea, vomiting. EXAM: CT ABDOMEN AND PELVIS WITH CONTRAST TECHNIQUE: Multidetector CT imaging of the abdomen and pelvis was performed using the standard protocol following bolus administration of intravenous contrast. CONTRAST:  100mL ISOVUE-300 IOPAMIDOL (ISOVUE-300) INJECTION 61% COMPARISON:  Prior CT from 07/08/2017. FINDINGS: Lower chest: Scattered atelectatic changes seen dependently within the visualized lung bases. Visualized lungs are otherwise clear. Mild cardiomegaly noted. Hepatobiliary: Liver demonstrates a normal contrast enhanced appearance. Gallbladder surgically absent. Mild prominence of the common bile duct consistent with post cholecystectomy changes and age. Pancreas: Pancreas within normal  limits. Spleen: Spleen within normal limits. Adrenals/Urinary Tract: Adrenal glands are normal. Kidneys equal  size with symmetric enhancement. No nephrolithiasis, hydronephrosis, or focal enhancing renal mass. No hydroureter. Partially distended bladder within normal limits. Mild circumferential bladder wall thickening most like related incomplete distension. Stomach/Bowel: Fluid density present within the distal esophageal lumen, which may reflect sequelae of reflux disease. Stomach within normal limits. No evidence for bowel obstruction. Few mildly prominent fluid-filled loops of bowel seen scattered throughout the lower abdomen, nonspecific, but can be seen in the setting of an acute enteritis. Appendix visualized within the right abdomen and is normal without evidence for acute appendicitis. Rectal vault is somewhat patulous with mild mucosal enhancement and intraluminal fluid density, which could reflect sequelae of an acute diarrheal illness. Mild stranding within the adjacent perirectal fat and presacral space. No other acute inflammatory changes seen about the bowels. Vascular/Lymphatic: Moderate aorto bi-iliac atherosclerotic disease. Normal intravascular enhancement seen throughout the intra-abdominal aorta. No aneurysm. Mesenteric vessels are patent proximally. No adenopathy seen within the abdomen and pelvis. Reproductive: 1 cm calcification within the uterus suspected to reflect a calcified uterine fibroid. Uterus otherwise unremarkable. Ovaries within normal limits. Other: No free air. No other free fluid or loculated fluid collection. Musculoskeletal: No acute osseous abnormality. Remotely healed right inferior pubic ramus fracture noted. Extensive degenerative changes noted about the hips bilaterally. 4.6 x 4.9 cm collection at the anterior aspect of the proximal right femur noted, indeterminate, but likely degenerative (series 2, image 80). No associated inflammatory changes. Extensive changes  related to calcific tendinopathy noted at the ischial tuberosities bilaterally. Advanced multilevel degenerative changes noted within the visualized spine. No discrete or worrisome osseous lesions. Diffuse osteopenia. Small focus of fat necrosis noted within subcutaneous fat of the left flank. IMPRESSION: 1. Mild mucosal enhancement with adjacent hazy fat stranding in intraluminal fluid density within the distal colon/rectum, suggesting possible acute diarrheal illness/mild colitis. 2. Few scattered mildly prominent fluid-filled loops of small bowel within the lower abdomen, nonspecific, but could reflect an associated enteritis given patient's symptoms. No evidence for obstruction. 3. No other acute intra-abdominal or pelvic process identified. 4. 4.6 cm fluid collection at the proximal right femur, nonspecific, but suspected to be degenerative in nature. Correlation with physical exam recommended. 5. Moderate aorto bi-iliac atherosclerotic disease. Electronically Signed   By: Rise Mu M.D.   On: 12/28/2017 01:27    ASSESSMENT AND PLAN:   Active Problems:   Gastroenteritis   Pressure injury of skin  1.  Gastroenteritis: Noroavirus.  The patient is afebrile.     Advance diet as tolerated.  Hydrate with intravenous fluid until patient is consistently taking food by mouth.   Supportive care for nausea, IV fluids. 2.  Venous insufficiency: The patient has swelling of her lower extremities.  She has a complicated Lasix regimen from home which we will verify with her pharmacy.  Continue diuretic therapy per home regimen. 3.  Glaucoma: Continue Cosopt 4.  History of hip fracture: Left; PT/OT while hospitalized to establish realistic goals for family who is very concerned about the patient's slow progress.    She is gradually loosing functions for last few months since a fall. But daughter is very unhappy with the care at [facility], and want to take her home. 5.  Hyperlipidemia: Continue statin  therapy 6.  DVT prophylaxis: Lovenox 7.  GI prophylaxis: None 8. Ac respi failure- she may have age related lung changes and we also gave IV fluids, get xray and may need iv lasix.   If not improved, may need Oxygen arrangement on d/c.  All  the records are reviewed and case discussed with Care Management/Social Workerr. Management plans discussed with the patient, family and they are in agreement.  CODE STATUS: Full.  TOTAL TIME TAKING CARE OF THIS PATIENT: 40 minutes.     POSSIBLE D/C IN 1-2 DAYS, DEPENDING ON CLINICAL CONDITION.   Altamese Dilling M.D on 12/29/2017   Between 7am to 6pm - Pager - [phone]  After 6pm go to [email or web address] - password [email or web address]  [facility]  Office  [phone]  CC: Primary care physician; Lynnea Ferrier, MD  Note: This dictation was prepared with Dragon dictation along with smaller phrase technology. Any transcriptional errors that result from this process are unintentional.

## 2017-12-29 NOTE — Care Management (Signed)
Contacted by primary nurse and informed patient has to DC on oxygen.  Discussed that patient was not on oxygen yesterday when CM met with patient and daughter.  There is no  documentation of an underlying  chronic cp diagnosis.  Do not see documentation of any room air sats that would qualify need for oxygen.  Upon presentation, patient's room air sat was 97. Advised primary nurse to wean and or perform home 02 assessment.

## 2017-12-29 NOTE — Care Management (Signed)
Informed by primary nurse that patient's 02 sats on room air was down to 88%.  Discussed that patient was receiving IVF of at least 100 cc/hr. Discussed the need to notify the attending as may want to postphone the discharge and order some IV lasix

## 2017-12-29 NOTE — Discharge Summary (Addendum)
[facility] [place] at [facility]   PATIENT NAME: Kristin Graham    MR#:  [id number]  DATE OF BIRTH:  1915-12-24  DATE OF ADMISSION:  12/27/2017 ADMITTING PHYSICIAN: Arnaldo Natal, MD  DATE OF DISCHARGE: 12/29/2017  PRIMARY CARE PHYSICIAN: Lynnea Ferrier, MD    ADMISSION DIAGNOSIS:  Enteritis due to Norovirus [A08.11]  DISCHARGE DIAGNOSIS:  Active Problems:   Gastroenteritis   Pressure injury of skin   Norvovirus infection.  SECONDARY DIAGNOSIS:   Past Medical History:  Diagnosis Date  . Bilateral carpal tunnel syndrome    S/P surgery  . Depression    unspecified  . Foot fracture, right   . Glaucoma   . Left leg weakness    Progressive, evaluated by Ortho. LS spine MRI with enhancing mass involving L2 vertebra. oncology evaluation with Dr. Doylene Canning 6/11; bone scan negative; PET scan negative. 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 Neurosurgery consultation arranged by Oncology.  . Left shoulder tendinitis    with partial rotator cuff tear evaluated by Dr. Erin Sons  . Leg edema    likely venous insufficiency. Declines compression hose. Declines physical therapy. Echo 3/10 with normal heart function, mild MR.  . Osteoarthritis   . Osteoporosis, post-menopausal   . Stroke (HCC)    History of CVA versus TIA, 1996, with reported episodes of TIA since then. Left pontine CVA by MRI 12/14.  [name] Urinary incontinence   . Vision loss of left eye    followed by Dr. Fransico Michael. Possibly glaucoma-induced versus ischemic neuropathy from vascular disease.    HOSPITAL COURSE:   1. Gastroenteritis: Noroavirus. The patient is afebrile.   Advance diet as tolerated. Hydrate with intravenous fluid    Now able to tolerate oral diet.   Supportive care for nausea, IV fluids.   Diarrhea stopped, medically stable for discharge. 2. Venous insufficiency: The patient has swelling of her lower extremities.    Lasix regimen from home,Continue diuretic therapy per home regimen. 3. Glaucoma: Continue Cosopt 4. History of hip fracture: Left; PT/OT while hospitalized to establish realistic goals for family who is very concerned about the patient's slow progress.  She is gradually loosing functions for last few months since a fall. But daughter is very unhappy with the care at [facility], and want to take her home.  As pt medically stable today, I spoke to her daughter again- She will have weakness and that is going to persist for long time or likely may not recover due to very old age, but she does not want her to go to NH, we can discharge home today, arranging PT, RN, OT, SW, Aid.  I had also discussed code status with daughter yesterday, but she is " stressed out due to other issues now" so no decision yet. This pt is at very high risk of readmission. 5. Hyperlipidemia: Continue statin therapy 6. DVT prophylaxis: Lovenox 7. GI prophylaxis: None   DISCHARGE CONDITIONS:   Stable.  CONSULTS OBTAINED:    DRUG ALLERGIES:   Allergies  Allergen Reactions  . Bee Venom Swelling  . Chocolate   . Nsaids   . Vicodin [Hydrocodone-Acetaminophen]     DISCHARGE MEDICATIONS:   Allergies as of 12/29/2017      Reactions   Bee Venom Swelling   Chocolate    Nsaids    Vicodin [hydrocodone-acetaminophen]       Medication List    STOP taking these medications   magnesium hydroxide  400 MG/5ML suspension Commonly known as:  MILK OF MAGNESIA   METHYLSULFONYLMETHANE PO     TAKE these medications   acetaminophen 500 MG tablet Commonly known as:  TYLENOL Take 250 mg by mouth at bedtime. 1/2 tablet per home routine   acetaminophen 325 MG tablet Commonly known as:  TYLENOL Take 650 mg by mouth every 4 (four) hours as needed.   ascorbic acid 1000 MG tablet Commonly known as:  VITAMIN C Take 1,000 mg by mouth 2 (two) times daily.   aspirin 325 MG tablet Take 325 mg by mouth daily.    atorvastatin 10 MG tablet Commonly known as:  LIPITOR Take 10 mg by mouth daily.   bimatoprost 0.03 % ophthalmic solution Commonly known as:  LUMIGAN Place 1 drop into both eyes at bedtime.   calcium carbonate 1250 (500 Ca) MG tablet Commonly known as:  OS-CAL - dosed in mg of elemental calcium Take 1 tablet by mouth 2 (two) times daily.   camphor-menthol lotion Commonly known as:  SARNA Apply 1 application topically daily as needed for itching. Apply to affected area   CENTRUM SILVER tablet Take 1 tablet by mouth daily.   CRANBERRY PLUS VITAMIN C 4200-20-3 MG-MG-UNIT Caps Generic drug:  Cranberry-Vitamin C-Vitamin E Take 1 capsule by mouth daily.   docusate sodium 100 MG capsule Commonly known as:  COLACE Take 100 mg by mouth daily.   dorzolamide-timolol 22.3-6.8 MG/ML ophthalmic solution Commonly known as:  COSOPT Place 1 drop into both eyes 2 (two) times daily.   feeding supplement (ENSURE ENLIVE) Liqd Take 237 mLs by mouth 2 (two) times daily between meals. Start taking on:  12/30/2017   furosemide 20 MG tablet Commonly known as:  LASIX Take 30 mg by mouth daily. What changed:  Another medication with the same name was removed. Continue taking this medication, and follow the directions you see here.   Ginkgo Biloba 60 MG Caps Take 1 capsule by mouth daily.   magnesium oxide 400 MG tablet Commonly known as:  MAG-OX Take 200 mg by mouth daily.   ondansetron 4 MG tablet Commonly known as:  ZOFRAN Take 1 tablet (4 mg total) by mouth every 6 (six) hours as needed for nausea.   OXYGEN Inhale 2 L into the lungs continuous.   RISA-BID PROBIOTIC Tabs Take 1 tablet by mouth daily.   traMADol 50 MG tablet Commonly known as:  ULTRAM Take 25 mg by mouth at bedtime. 1/2 tablet   traMADol 50 MG tablet Commonly known as:  ULTRAM Take 50 mg by mouth every 6 (six) hours as needed.   vitamin B-12 100 MCG tablet Commonly known as:  CYANOCOBALAMIN Take 100 mcg by  mouth daily.   Vitamin E 400 units Tabs Take 1 tablet by mouth daily.   Vitamin-B Complex Tabs Take 1 tablet by mouth daily.   ZINC-220 PO Take 1 tablet by mouth daily.            Durable Medical Equipment  (From admission, onward)        Start     Ordered   12/28/17 1357  For home use only DME Hospital bed  Once    Question Answer Comment  Patient has (list medical condition): Weakness, sciatica, neuropathy   The above medical condition requires: Patient requires the ability to reposition frequently   Head must be elevated greater than: 45 degrees   Bed type Semi-electric      12/28/17 1357  DISCHARGE INSTRUCTIONS:   Follow with PMD in 5-6 days.  If you experience worsening of your admission symptoms, develop shortness of breath, life threatening emergency, suicidal or homicidal thoughts you must seek medical attention immediately by calling 911 or calling your MD immediately  if symptoms less severe.  You Must read complete instructions/literature along with all the possible adverse reactions/side effects for all the Medicines you take and that have been prescribed to you. Take any new Medicines after you have completely understood and accept all the possible adverse reactions/side effects.   Please note  You were cared for by a hospitalist during your hospital stay. If you have any questions about your discharge medications or the care you received while you were in the hospital after you are discharged, you can call the unit and asked to speak with the hospitalist on call if the hospitalist that took care of you is not available. Once you are discharged, your primary care physician will handle any further medical issues. Please note that NO REFILLS for any discharge medications will be authorized once you are discharged, as it is imperative that you return to your primary care physician (or establish a relationship with a primary care physician if you do not have  one) for your aftercare needs so that they can reassess your need for medications and monitor your lab values.    Today   CHIEF COMPLAINT:   Chief Complaint  Patient presents with  . Nausea  . Emesis  . Diarrhea    HISTORY OF PRESENT ILLNESS:  Kristin Graham  is a 84 y.o. female with a known history of glaucoma, pelvic fracture last year, history of stroke and generalized weakness presents emergency department from [facility] with diarrhea and reported fever.  Patient is also had a few episodes of nonbloody nonbilious emesis.  Symptoms began approximately 12 hours prior to admission.  The patient does not have any mental status change but does feel more generally weak.  In the emergency department the patient was found to be positive for neurovirus.  IV hydration was started prior to the emergency department staff called the hospitalist service for admission.   VITAL SIGNS:  Blood pressure (!) 104/58, pulse 77, temperature 98.6 F (37 C), temperature source Oral, resp. rate 16, height 5\' 3"  (1.6 m), weight 85.1 kg (187 lb 11.2 oz), SpO2 99 %.  I/O:    Intake/Output Summary (Last 24 hours) at 12/29/2017 1601 Last data filed at 12/28/2017 1820 Gross per 24 hour  Intake 0 ml  Output -  Net 0 ml    PHYSICAL EXAMINATION:   GENERAL:  84 y.o.-year-old patient lying in the bed with no acute distress.  EYES: Pupils equal, round, reactive to light and accommodation. No scleral icterus. Extraocular muscles intact.  HEENT: Head atraumatic, normocephalic. Oropharynx and nasopharynx clear.  NECK:  Supple, no jugular venous distention. No thyroid enlargement, no tenderness.  LUNGS: Normal breath sounds bilaterally, no wheezing, rales,rhonchi or crepitation. No use of accessory muscles of respiration.  CARDIOVASCULAR: S1, S2 normal. have murmurs .  ABDOMEN: Soft, nontender, nondistended. Bowel sounds present. No organomegaly or mass.  EXTREMITIES: No pedal edema, cyanosis, or clubbing.   NEUROLOGIC: Cranial nerves II through XII are intact. Muscle strength 3/5 in all extremities. Sensation intact. Gait not checked. Vision loss ( At baseline) PSYCHIATRIC: The patient is alert and oriented x 1.  SKIN: No obvious rash, lesion, or ulcer.     DATA REVIEW:   CBC Recent Labs  Lab  12/27/17 2358  WBC 17.9*  HGB 15.5  HCT 47.2*  PLT 251    Chemistries  Recent Labs  Lab 12/27/17 2358  NA 139  K 4.3  CL 101  CO2 26  GLUCOSE 176*  BUN 17  CREATININE 0.51  CALCIUM 8.6*  AST 30  ALT 24  ALKPHOS 73  BILITOT 1.0    Cardiac Enzymes No results for input(s): TROPONINI in the last 168 hours.  Microbiology Results  Results for orders placed or performed during the hospital encounter of 12/27/17  Gastrointestinal Panel by PCR , Stool     Status: Abnormal   Collection Time: 12/27/17 11:41 PM  Result Value Ref Range Status   Campylobacter species NOT DETECTED NOT DETECTED Final   Plesimonas shigelloides NOT DETECTED NOT DETECTED Final   Salmonella species NOT DETECTED NOT DETECTED Final   Yersinia enterocolitica NOT DETECTED NOT DETECTED Final   Vibrio species NOT DETECTED NOT DETECTED Final   Vibrio cholerae NOT DETECTED NOT DETECTED Final   Enteroaggregative E coli (EAEC) NOT DETECTED NOT DETECTED Final   Enteropathogenic E coli (EPEC) NOT DETECTED NOT DETECTED Final   Enterotoxigenic E coli (ETEC) NOT DETECTED NOT DETECTED Final   Shiga like toxin producing E coli (STEC) NOT DETECTED NOT DETECTED Final   Shigella/Enteroinvasive E coli (EIEC) NOT DETECTED NOT DETECTED Final   Cryptosporidium NOT DETECTED NOT DETECTED Final   Cyclospora cayetanensis NOT DETECTED NOT DETECTED Final   Entamoeba histolytica NOT DETECTED NOT DETECTED Final   Giardia lamblia NOT DETECTED NOT DETECTED Final   Adenovirus F40/41 NOT DETECTED NOT DETECTED Final   Astrovirus NOT DETECTED NOT DETECTED Final   Norovirus GI/GII DETECTED (A) NOT DETECTED Final    Comment: RESULT CALLED TO,  READ BACK BY AND VERIFIED WITH: VANESSA ASHLEY @ 0134 ON 12/28/2017 BYCAF    Rotavirus A NOT DETECTED NOT DETECTED Final   Sapovirus (I, II, IV, and V) NOT DETECTED NOT DETECTED Final    Comment: Performed at [facility], [address] [place], [place] [address]  C difficile quick scan w PCR reflex     Status: None   Collection Time: 12/27/17 11:59 PM  Result Value Ref Range Status   C Diff antigen NEGATIVE NEGATIVE Final   C Diff toxin NEGATIVE NEGATIVE Final   C Diff interpretation No C. difficile detected.  Final    Comment: VALID Performed at [facility], [address] [place], [place] [address]   MRSA PCR Screening     Status: None   Collection Time: 12/28/17  5:41 AM  Result Value Ref Range Status   MRSA by PCR NEGATIVE NEGATIVE Final    Comment:        The GeneXpert MRSA Assay (FDA approved for NASAL specimens only), is one component of a comprehensive MRSA colonization surveillance program. It is not intended to diagnose MRSA infection nor to guide or monitor treatment for MRSA infections. Performed at [facility], [address] [place], [place] [address]     RADIOLOGY:  Ct Abdomen Pelvis W Contrast  Result Date: 12/28/2017 CLINICAL DATA:  Initial evaluation for generalized abdominal pain, nausea, vomiting. EXAM: CT ABDOMEN AND PELVIS WITH CONTRAST TECHNIQUE: Multidetector CT imaging of the abdomen and pelvis was performed using the standard protocol following bolus administration of intravenous contrast. CONTRAST:  ISOVUE-300 IOPAMIDOL (ISOVUE-300) INJECTION 61% COMPARISON:  Prior CT from 07/08/2017. FINDINGS: Lower chest: Scattered atelectatic changes seen dependently within the visualized lung bases. Visualized lungs are otherwise  clear. Mild cardiomegaly noted. Hepatobiliary: Liver demonstrates a normal contrast enhanced appearance. Gallbladder surgically absent. Mild prominence of the common bile duct consistent with post  cholecystectomy changes and age. Pancreas: Pancreas within normal limits. Spleen: Spleen within normal limits. Adrenals/Urinary Tract: Adrenal glands are normal. Kidneys equal size with symmetric enhancement. No nephrolithiasis, hydronephrosis, or focal enhancing renal mass. No hydroureter. Partially distended bladder within normal limits. Mild circumferential bladder wall thickening most like related incomplete distension. Stomach/Bowel: Fluid density present within the distal esophageal lumen, which may reflect sequelae of reflux disease. Stomach within normal limits. No evidence for bowel obstruction. Few mildly prominent fluid-filled loops of bowel seen scattered throughout the lower abdomen, nonspecific, but can be seen in the setting of an acute enteritis. Appendix visualized within the right abdomen and is normal without evidence for acute appendicitis. Rectal vault is somewhat patulous with mild mucosal enhancement and intraluminal fluid density, which could reflect sequelae of an acute diarrheal illness. Mild stranding within the adjacent perirectal fat and presacral space. No other acute inflammatory changes seen about the bowels. Vascular/Lymphatic: Moderate aorto bi-iliac atherosclerotic disease. Normal intravascular enhancement seen throughout the intra-abdominal aorta. No aneurysm. Mesenteric vessels are patent proximally. No adenopathy seen within the abdomen and pelvis. Reproductive: 1 cm calcification within the uterus suspected to reflect a calcified uterine fibroid. Uterus otherwise unremarkable. Ovaries within normal limits. Other: No free air. No other free fluid or loculated fluid collection. Musculoskeletal: No acute osseous abnormality. Remotely healed right inferior pubic ramus fracture noted. Extensive degenerative changes noted about the hips bilaterally. 4.6 x 4.9 cm collection at the anterior aspect of the proximal right femur noted, indeterminate, but likely degenerative (series 2,  image 80). No associated inflammatory changes. Extensive changes related to calcific tendinopathy noted at the ischial tuberosities bilaterally. Advanced multilevel degenerative changes noted within the visualized spine. No discrete or worrisome osseous lesions. Diffuse osteopenia. Small focus of fat necrosis noted within subcutaneous fat of the left flank. IMPRESSION: 1. Mild mucosal enhancement with adjacent hazy fat stranding in intraluminal fluid density within the distal colon/rectum, suggesting possible acute diarrheal illness/mild colitis. 2. Few scattered mildly prominent fluid-filled loops of small bowel within the lower abdomen, nonspecific, but could reflect an associated enteritis given patient's symptoms. No evidence for obstruction. 3. No other acute intra-abdominal or pelvic process identified. 4. 4.6 cm fluid collection at the proximal right femur, nonspecific, but suspected to be degenerative in nature. Correlation with physical exam recommended. 5. Moderate aorto bi-iliac atherosclerotic disease. Electronically Signed   By: Rise MuBenjamin  McClintock M.D.   On: 12/28/2017 01:27    EKG:   Orders placed or performed during the hospital encounter of 12/27/17  . EKG 12-Lead  . EKG 12-Lead      Management plans discussed with the patient, family and they are in agreement.  CODE STATUS: Full.    Code Status Orders  (From admission, onward)        Start     Ordered   12/28/17 0443  Full code  Continuous     12/28/17 0442    Code Status History    Date Active Date Inactive Code Status Order ID Comments User Context   07/08/2017 0352 07/10/2017 2056 Full Code [id number]  Gery Prayrosley, Debby, MD ED    Advance Directive Documentation     Most Recent Value  Type of Advance Directive  Healthcare Power of Attorney  Pre-existing out of facility DNR order (yellow form or pink MOST form)  -  "MOST" Form in Place?  -  TOTAL TIME TAKING CARE OF THIS PATIENT: 35 minutes.    Altamese Dilling M.D on 12/29/2017 at 4:01 PM  Between 7am to 6pm - Pager - [phone]  After 6pm go to [email or web address] - password [email or web address]  [facility] [place] [facility]  Office  [phone]  CC: Primary care physician; Lynnea Ferrier, MD   Note: This dictation was prepared with Dragon dictation along with smaller phrase technology. Any transcriptional errors that result from this process are unintentional.

## 2017-12-29 NOTE — Progress Notes (Signed)
[facility] - [place] at [facility]   PATIENT NAME: Kristin Graham    MR#:  [id number]  DATE OF BIRTH:  1916-06-08  SUBJECTIVE:  CHIEF COMPLAINT:   Chief Complaint  Patient presents with  . Nausea  . Emesis  . Diarrhea    Brought from NH with diarrhea and vomit, Noted to have Norvo virus.  No vomit since morning, still have Loose BM. Pt is not well oriented, daughter in room.  REVIEW OF SYSTEMS:  Pt is not well oriented.  ROS  DRUG ALLERGIES:   Allergies  Allergen Reactions  . Bee Venom Swelling  . Chocolate   . Nsaids   . Vicodin [Hydrocodone-Acetaminophen]     VITALS:  Blood pressure (!) 110/51, pulse 92, temperature 98.6 F (37 C), temperature source Oral, resp. rate 17, height 5\' 3"  (1.6 m), weight 85.1 kg (187 lb 11.2 oz), SpO2 95 %.  PHYSICAL EXAMINATION:  GENERAL:  84 y.o.-year-old patient lying in the bed with no acute distress.  EYES: Pupils equal, round, reactive to light and accommodation. No scleral icterus. Extraocular muscles intact.  HEENT: Head atraumatic, normocephalic. Oropharynx and nasopharynx clear.  NECK:  Supple, no jugular venous distention. No thyroid enlargement, no tenderness.  LUNGS: Normal breath sounds bilaterally, no wheezing, rales,rhonchi or crepitation. No use of accessory muscles of respiration.  CARDIOVASCULAR: S1, S2 normal. have murmurs .  ABDOMEN: Soft, nontender, nondistended. Bowel sounds present. No organomegaly or mass.  EXTREMITIES: No pedal edema, cyanosis, or clubbing.  NEUROLOGIC: Cranial nerves II through XII are intact. Muscle strength 3/5 in all extremities. Sensation intact. Gait not checked. Vision loss ( At baseline) PSYCHIATRIC: The patient is alert and oriented x 1.  SKIN: No obvious rash, lesion, or ulcer.   Physical Exam LABORATORY PANEL:   CBC Recent Labs  Lab 12/27/17 2358  WBC 17.9*  HGB 15.5  HCT 47.2*  PLT 251    ------------------------------------------------------------------------------------------------------------------  Chemistries  Recent Labs  Lab 12/27/17 2358  NA 139  K 4.3  CL 101  CO2 26  GLUCOSE 176*  BUN 17  CREATININE 0.51  CALCIUM 8.6*  AST 30  ALT 24  ALKPHOS 73  BILITOT 1.0   ------------------------------------------------------------------------------------------------------------------  Cardiac Enzymes No results for input(s): TROPONINI in the last 168 hours. ------------------------------------------------------------------------------------------------------------------  RADIOLOGY:  Ct Abdomen Pelvis W Contrast  Result Date: 12/28/2017 CLINICAL DATA:  Initial evaluation for generalized abdominal pain, nausea, vomiting. EXAM: CT ABDOMEN AND PELVIS WITH CONTRAST TECHNIQUE: Multidetector CT imaging of the abdomen and pelvis was performed using the standard protocol following bolus administration of intravenous contrast. CONTRAST:  100mL ISOVUE-300 IOPAMIDOL (ISOVUE-300) INJECTION 61% COMPARISON:  Prior CT from 07/08/2017. FINDINGS: Lower chest: Scattered atelectatic changes seen dependently within the visualized lung bases. Visualized lungs are otherwise clear. Mild cardiomegaly noted. Hepatobiliary: Liver demonstrates a normal contrast enhanced appearance. Gallbladder surgically absent. Mild prominence of the common bile duct consistent with post cholecystectomy changes and age. Pancreas: Pancreas within normal limits. Spleen: Spleen within normal limits. Adrenals/Urinary Tract: Adrenal glands are normal. Kidneys equal size with symmetric enhancement. No nephrolithiasis, hydronephrosis, or focal enhancing renal mass. No hydroureter. Partially distended bladder within normal limits. Mild circumferential bladder wall thickening most like related incomplete distension. Stomach/Bowel: Fluid density present within the distal esophageal lumen, which may reflect sequelae of  reflux disease. Stomach within normal limits. No evidence for bowel obstruction. Few mildly prominent fluid-filled loops of bowel seen scattered throughout the lower abdomen, nonspecific, but can be seen in the setting of an  acute enteritis. Appendix visualized within the right abdomen and is normal without evidence for acute appendicitis. Rectal vault is somewhat patulous with mild mucosal enhancement and intraluminal fluid density, which could reflect sequelae of an acute diarrheal illness. Mild stranding within the adjacent perirectal fat and presacral space. No other acute inflammatory changes seen about the bowels. Vascular/Lymphatic: Moderate aorto bi-iliac atherosclerotic disease. Normal intravascular enhancement seen throughout the intra-abdominal aorta. No aneurysm. Mesenteric vessels are patent proximally. No adenopathy seen within the abdomen and pelvis. Reproductive: 1 cm calcification within the uterus suspected to reflect a calcified uterine fibroid. Uterus otherwise unremarkable. Ovaries within normal limits. Other: No free air. No other free fluid or loculated fluid collection. Musculoskeletal: No acute osseous abnormality. Remotely healed right inferior pubic ramus fracture noted. Extensive degenerative changes noted about the hips bilaterally. 4.6 x 4.9 cm collection at the anterior aspect of the proximal right femur noted, indeterminate, but likely degenerative (series 2, image 80). No associated inflammatory changes. Extensive changes related to calcific tendinopathy noted at the ischial tuberosities bilaterally. Advanced multilevel degenerative changes noted within the visualized spine. No discrete or worrisome osseous lesions. Diffuse osteopenia. Small focus of fat necrosis noted within subcutaneous fat of the left flank. IMPRESSION: 1. Mild mucosal enhancement with adjacent hazy fat stranding in intraluminal fluid density within the distal colon/rectum, suggesting possible acute diarrheal  illness/mild colitis. 2. Few scattered mildly prominent fluid-filled loops of small bowel within the lower abdomen, nonspecific, but could reflect an associated enteritis given patient's symptoms. No evidence for obstruction. 3. No other acute intra-abdominal or pelvic process identified. 4. 4.6 cm fluid collection at the proximal right femur, nonspecific, but suspected to be degenerative in nature. Correlation with physical exam recommended. 5. Moderate aorto bi-iliac atherosclerotic disease. Electronically Signed   By: Rise Mu M.D.   On: 12/28/2017 01:27    ASSESSMENT AND PLAN:   Active Problems:   Gastroenteritis   Pressure injury of skin  1.  Gastroenteritis: Noroavirus.  The patient is afebrile.     Advance diet as tolerated.  Hydrate with intravenous fluid until patient is consistently taking food by mouth.   Supportive care for nausea, IV fluids. 2.  Venous insufficiency: The patient has swelling of her lower extremities.  She has a complicated Lasix regimen from home which we will verify with her pharmacy.  Continue diuretic therapy per home regimen. 3.  Glaucoma: Continue Cosopt 4.  History of hip fracture: Left; PT/OT while hospitalized to establish realistic goals for family who is very concerned about the patient's slow progress.    She is gradually loosing functions for last few months since a fall. But daughter is very unhappy with the care at [facility], and want to take her home. 5.  Hyperlipidemia: Continue statin therapy 6.  DVT prophylaxis: Lovenox 7.  GI prophylaxis: None   All the records are reviewed and case discussed with Care Management/Social Workerr. Management plans discussed with the patient, family and they are in agreement.  CODE STATUS: Full.  TOTAL TIME TAKING CARE OF THIS PATIENT: 40 minutes.     POSSIBLE D/C IN 1-2 DAYS, DEPENDING ON CLINICAL CONDITION.   Altamese Dilling M.D on 12/29/2017   Between 7am to 6pm - Pager -  [phone]  After 6pm go to [email or web address] - password [email or web address]  [facility]  Office  [phone]  CC: Primary care physician; Lynnea Ferrier, MD  Note: This dictation was prepared with Dragon dictation along with smaller phrase technology. Any transcriptional errors  that result from this process are unintentional.

## 2017-12-29 NOTE — Care Management (Signed)
Spoke with daughter regarding discharge disposition and it is anticipate patient will discharge today.  Junious DresserConnie says she is waiting for call back from patient's pcp to discuss the need for patient perhaps to stay longer in the hospital because she herself has been exposed to the noro virus.  Also says her pcp relayed to her that patient could go to the hospice home "for about one week." for respite.  CM informed daughter that pcp does not manage the care and discharge plan of hospitalized patient- that it is up to the hospitalist.  Also relayed that patient is not currently an active hospice patient therefore would not qualify to stay at the hospice facility. Discussed that if Junious DresserConnie was worried about the possibility of she herself coming down with gi sx, may want to consider her mother going back to [place]. This is not an option.  Patient will transport by ems.   Notified [facility] of discharge and obtained order for RN PT OT Aide and SW.  Provided resource for in home caregivers but Junious DresserConnie says she is in communication with the "[facility]."    Updated staff to contact patient's daughter when ems picks up patient.  There are no DME needs.

## 2017-12-29 NOTE — Progress Notes (Signed)
Patient has had change in resp. status. Pt now presenting with Coarse crackles in All lung fields requiring 2L O2. MD notified. Discharge orders withdrawn. Chest XR ordered. Awaiting results. Daughter (POA) notified. Will continue to monitor for change.  Leroy SeaKimberly Samora Jernberg, LPN

## 2017-12-29 NOTE — Progress Notes (Signed)
Attempted to wean patient off O2, patients sats between 88-89% on room air. 2L re-administered. Will try to decrease O2 to 1L.  Leroy SeaKimberly Derrious Bologna, LPN

## 2017-12-30 ENCOUNTER — Encounter: Payer: Self-pay | Admitting: Internal Medicine

## 2017-12-30 LAB — BASIC METABOLIC PANEL
ANION GAP: 6 (ref 5–15)
BUN: 10 mg/dL (ref 6–20)
CALCIUM: 7.9 mg/dL — AB (ref 8.9–10.3)
CO2: 29 mmol/L (ref 22–32)
Chloride: 103 mmol/L (ref 101–111)
Creatinine, Ser: 0.32 mg/dL — ABNORMAL LOW (ref 0.44–1.00)
GLUCOSE: 88 mg/dL (ref 65–99)
POTASSIUM: 3.5 mmol/L (ref 3.5–5.1)
SODIUM: 138 mmol/L (ref 135–145)

## 2017-12-30 LAB — CBC
HCT: 38.9 % (ref 35.0–47.0)
Hemoglobin: 12.8 g/dL (ref 12.0–16.0)
MCH: 29.9 pg (ref 26.0–34.0)
MCHC: 32.8 g/dL (ref 32.0–36.0)
MCV: 91.4 fL (ref 80.0–100.0)
PLATELETS: 193 10*3/uL (ref 150–440)
RBC: 4.26 MIL/uL (ref 3.80–5.20)
RDW: 13 % (ref 11.5–14.5)
WBC: 8.5 10*3/uL (ref 3.6–11.0)

## 2017-12-30 MED ORDER — FUROSEMIDE 10 MG/ML IJ SOLN
40.0000 mg | Freq: Once | INTRAMUSCULAR | Status: AC
Start: 2017-12-30 — End: 2017-12-30
  Administered 2017-12-30: 13:00:00 40 mg via INTRAVENOUS
  Filled 2017-12-30: qty 4

## 2017-12-30 MED ORDER — FUROSEMIDE 10 MG/ML IJ SOLN
40.0000 mg | Freq: Once | INTRAMUSCULAR | Status: AC
  Administered 2017-12-30: 40 mg via INTRAVENOUS
  Filled 2017-12-30: qty 4

## 2017-12-30 MED ORDER — POTASSIUM CHLORIDE CRYS ER 20 MEQ PO TBCR
40.0000 meq | EXTENDED_RELEASE_TABLET | Freq: Once | ORAL | Status: DC
  Filled 2017-12-30: qty 2

## 2017-12-30 NOTE — Care Management (Signed)
RNCM spoke (a very long conversation) with patient's daughter Junious DresserConnie [phone] as she explained reason patient is in the hospital. She explained all she has tried and has been caring for patient for 18 years. She has a female friend that helps her. I expressed my concern for [name]'s health too. Patient is not on O2 at home and has no payer for home O2.  She is not interested in hospice at home. Patient will need EMS to return home but has to be weaned from O2. Junious DresserConnie is interested in home Palliative. She definitely [facility] with [facility].

## 2017-12-30 NOTE — Progress Notes (Signed)
[facility] - [place] at [facility]   PATIENT NAME: Kristin Graham    MR#:  [id number]  DATE OF BIRTH:  [birth year]  SUBJECTIVE:  CHIEF COMPLAINT:   Chief Complaint  Patient presents with  . Nausea  . Emesis  . Diarrhea   Patient continues to be on oxygen.  Awake and alert.  Eating.  REVIEW OF SYSTEMS:   Pt is not well oriented.  ROS  DRUG ALLERGIES:   Allergies  Allergen Reactions  . Bee Venom Swelling  . Chocolate   . Nsaids   . Vicodin [Hydrocodone-Acetaminophen]     VITALS:  Blood pressure (!) 110/58, pulse 82, temperature (!) 97.4 F (36.3 C), temperature source Oral, resp. rate 20, height 5\' 3"  (1.6 m), weight 83.5 kg (184 lb 1.4 oz), SpO2 90 %.  PHYSICAL EXAMINATION:  GENERAL:  84 y.o.-year-old patient lying in the bed with no acute distress.  EYES: Pupils equal, round, reactive to light and accommodation. No scleral icterus. Extraocular muscles intact.  HEENT: Head atraumatic, normocephalic. Oropharynx and nasopharynx clear.  NECK:  Supple, no jugular venous distention. No thyroid enlargement, no tenderness.  LUNGS: Normal breath sounds bilaterally, no wheezing, rales,rhonchi or crepitation. No use of accessory muscles of respiration.  CARDIOVASCULAR: S1, S2 normal. have murmurs .  ABDOMEN: Soft, nontender, nondistended. Bowel sounds present. No organomegaly or mass.  EXTREMITIES: No pedal edema, cyanosis, or clubbing.  NEUROLOGIC: Cranial nerves II through XII are intact. Muscle strength 3/5 in all extremities. Sensation intact. Gait not checked. Vision loss ( At baseline) PSYCHIATRIC: The patient is alert and awake SKIN: No obvious rash, lesion, or ulcer.   Physical Exam LABORATORY PANEL:   CBC Recent Labs  Lab 12/30/17 0432  WBC 8.5  HGB 12.8  HCT 38.9  PLT 193   ------------------------------------------------------------------------------------------------------------------  Chemistries  Recent Labs  Lab 12/27/17 2358  12/30/17 0432  NA 139 138  K 4.3 3.5  CL 101 103  CO2 26 29  GLUCOSE 176* 88  BUN 17 10  CREATININE 0.51 0.32*  CALCIUM 8.6* 7.9*  AST 30  --   ALT 24  --   ALKPHOS 73  --   BILITOT 1.0  --    ------------------------------------------------------------------------------------------------------------------  Cardiac Enzymes No results for input(s): TROPONINI in the last 168 hours. ------------------------------------------------------------------------------------------------------------------  RADIOLOGY:  Dg Chest 1 View  Result Date: 12/29/2017 CLINICAL DATA:  Coarse crackles hypoxia EXAM: CHEST  1 VIEW COMPARISON:  07/08/2017 FINDINGS: Small pleural effusions. Bibasilar atelectasis or infiltrate. Borderline to mild cardiomegaly. Aortic atherosclerosis. No pneumothorax. IMPRESSION: 1. Small pleural effusions and bibasilar atelectasis or infiltrate 2. Stable borderline to mild cardiomegaly. Electronically Signed   By: Jasmine Pang M.D.   On: 12/29/2017 18:27    ASSESSMENT AND PLAN:   Active Problems:   Gastroenteritis   Pressure injury of skin  1.  Gastroenteritis: Norovirus.   Diarrhea is resolved.  Tolerating diet.  2.    Acute on chronic diastolic congestive heart failure.  Has chronic lower extremity swelling.  Chest x-ray showing pulmonary edema.  Will give 1 dose of IV Lasix.  Reassess.  With acute hypoxic respiratory failure.  3.  Glaucoma: Continue Cosopt 4.  History of left hip fracture.  Patient will continue PT OT at home. 5.  Hyperlipidemia: Continue statin therapy 6.  DVT prophylaxis: Lovenox  Recommendation was patient to go to skilled nursing facility.  But daughter wants to take patient home with home health services.  Likely discharge tomorrow.  If she continues  to need oxygen will have to set that up.   All the records are reviewed and case discussed with Care Management/Social Workerr. Management plans discussed with the patient, family and they  are in agreement.  CODE STATUS: Full.  TOTAL TIME TAKING CARE OF THIS PATIENT: 40 minutes.   POSSIBLE D/C IN 1-2 DAYS, DEPENDING ON CLINICAL CONDITION.  [name] on 12/30/2017   Between 7am to 6pm - Pager - [phone]  After 6pm go to [email or web address] - password [email or web address]  [facility]  Office  [phone]  CC: Primary care physician; [name], [name], MD  Note: This dictation was prepared with Dragon dictation along with smaller phrase technology. Any transcriptional errors that result from this process are unintentional.

## 2017-12-30 NOTE — Care Management (Addendum)
RNCM spoke with RN and patient is weaned to 1 Liter acute O2 and I do not expect patient to return home today. Daughter has declined home palliative but agrees with home health. [facility] from [facility]. Per daughter patient as hospital bed but now has a pressure ulcer.

## 2017-12-30 NOTE — Care Management Obs Status (Signed)
MEDICARE OBSERVATION STATUS NOTIFICATION   Patient Details  Name: Kristin Graham MRN: [id number] Date of Birth: 1916/02/26   Medicare Observation Status Notification Given:  Yes  Notified daughter Junious DresserConnie by phone [phone]  Collie SiadAngela Bernadean Saling, RN 12/30/2017, 10:34 AM

## 2017-12-31 NOTE — Care Management (Signed)
Discharge to home today per Dr.Sudini. Air mattress and Home Health services will be arranged per [facility]. Vaughan BastaJermaine, [facility] representative updated. Palliative Care per [facility] in the home. Transportation will be arranged per [facility]. Gwenette GreetBrenda S Rhapsody Wolven RN MSN CCM Care Management [phone]

## 2017-12-31 NOTE — Plan of Care (Signed)
Patient notably confuse, at some times expressed anger/hatred towards staff.  Daughter/HCPOA called and spoke with patient to calm her down. Able to take and tolerate her night time medicines. Bed alarm on, fall precautions in place. Continues to be on enteric precautions for positive norovirus. No diarrhea noted this shift. Needs attended, will continue to monitor.

## 2017-12-31 NOTE — Progress Notes (Signed)
Pt is being discharged today, all paperwork reviewed over the phone with the daughter, she verified understanding. EMS was contacted for transport. IV x1 removed all belongings were packed and returned to the patient.

## 2017-12-31 NOTE — Discharge Summary (Signed)
[employer] - [place] at [facility]   PATIENT NAME: Kristin Graham    MR#:  [id number]  DATE OF BIRTH:  1915-12-13  DATE OF ADMISSION:  12/27/2017 ADMITTING PHYSICIAN: Arnaldo Natal, MD  DATE OF DISCHARGE: 12/31/2017 12:16 PM  PRIMARY CARE PHYSICIAN: Lynnea Ferrier, MD   ADMISSION DIAGNOSIS:  Enteritis due to Norovirus [A08.11]  DISCHARGE DIAGNOSIS:  Active Problems:   Gastroenteritis   Pressure injury of skin   SECONDARY DIAGNOSIS:   Past Medical History:  Diagnosis Date  . Bilateral carpal tunnel syndrome    S/P surgery  . Depression    unspecified  . Foot fracture, right   . Glaucoma   . Left leg weakness    Progressive, evaluated by Ortho. LS spine MRI with enhancing mass involving L2 vertebra. oncology evaluation with Dr. Doylene Canning 6/11; bone scan negative; PET scan negative. 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 Neurosurgery consultation arranged by Oncology.  . Left shoulder tendinitis    with partial rotator cuff tear evaluated by Dr. Erin Sons  . Leg edema    likely venous insufficiency. Declines compression hose. Declines physical therapy. Echo 3/10 with normal heart function, mild MR.  . Osteoarthritis   . Osteoporosis, post-menopausal   . Stroke (HCC)    History of CVA versus TIA, 1996, with reported episodes of TIA since then. Left pontine CVA by MRI 12/14.  [name] Urinary incontinence   . Vision loss of left eye    followed by Dr. Fransico Michael. Possibly glaucoma-induced versus ischemic neuropathy from vascular disease.     ADMITTING HISTORY  This is a 84 year old female admitted for gastroenteritis. 1.  Gastroenteritis: Noroavirus.  The patient is afebrile.  She is very fatigued but will answer questions appropriately.  She does not meet criteria for sepsis at this time.  Continue to observe.  Advance diet as tolerated.  Hydrate with intravenous fluid until patient is consistently taking food by  mouth. 2.  Venous insufficiency: The patient has swelling of her lower extremities.  She has a complicated Lasix regimen from home which we will verify with her pharmacy.  Continue diuretic therapy per home regimen. 3.  Glaucoma: Continue Cosopt 4.  History of hip fracture: Left; PT/OT while hospitalized to establish realistic goals for family who is very concerned about the patient's slow progress.  Exline 5.  Hyperlipidemia: Continue statin therapy  HOSPITAL COURSE:   1. Gastroenteritis: Norovirus on stool PCR Diarrhea is resolved.   Tolerating diet.  2.   Acute on chronic diastolic congestive heart failure due to IV fluids.  Patient given 2 days of IV Lasix with improvement.  She will be continued on oral Lasix that she was taking at home. Acute hypoxic respiratory failure has resolved.  3. Glaucoma: Continue Cosopt 4. History of left hip fracture.  Patient will continue PT OT at home.  Patient was admitted from skilled nursing facility at the hospital.  Daughter was not happy with the skilled nursing facility and has requested patient be discharged home with home health.  This has been set up.  Hospital bed and air mattress delivered to the house.  Patient is at high risk for future complications.  DO NOT RESUSCITATE recommended but she is continuing to be full code.  Discussed at length with daughter regarding complications.  She wants to discuss with her brother and make a decision regarding this.  I have also advised hospice following the patient.  At this time  we are setting up palliative care visit as requested by the daughter.  Palliative care to follow at home  Patient is stable for discharge.  High risk for readmissions.    CONSULTS OBTAINED:    DRUG ALLERGIES:   Allergies  Allergen Reactions  . Bee Venom Swelling  . Chocolate   . Nsaids   . Vicodin [Hydrocodone-Acetaminophen]     DISCHARGE MEDICATIONS:   Allergies as of 12/31/2017      Reactions   Bee  Venom Swelling   Chocolate    Nsaids    Vicodin [hydrocodone-acetaminophen]       Medication List    STOP taking these medications   magnesium hydroxide 400 MG/5ML suspension Commonly known as:  MILK OF MAGNESIA   METHYLSULFONYLMETHANE PO     TAKE these medications   acetaminophen 500 MG tablet Commonly known as:  TYLENOL Take 250 mg by mouth at bedtime. 1/2 tablet per home routine   acetaminophen 325 MG tablet Commonly known as:  TYLENOL Take 650 mg by mouth every 4 (four) hours as needed.   ascorbic acid 1000 MG tablet Commonly known as:  VITAMIN C Take 1,000 mg by mouth 2 (two) times daily.   aspirin 325 MG tablet Take 325 mg by mouth daily.   atorvastatin 10 MG tablet Commonly known as:  LIPITOR Take 10 mg by mouth daily.   bimatoprost 0.03 % ophthalmic solution Commonly known as:  LUMIGAN Place 1 drop into both eyes at bedtime.   calcium carbonate 1250 (500 Ca) MG tablet Commonly known as:  OS-CAL - dosed in mg of elemental calcium Take 1 tablet by mouth 2 (two) times daily.   camphor-menthol lotion Commonly known as:  SARNA Apply 1 application topically daily as needed for itching. Apply to affected area   CENTRUM SILVER tablet Take 1 tablet by mouth daily.   CRANBERRY PLUS VITAMIN C 4200-20-3 MG-MG-UNIT Caps Generic drug:  Cranberry-Vitamin C-Vitamin E Take 1 capsule by mouth daily.   docusate sodium 100 MG capsule Commonly known as:  COLACE Take 100 mg by mouth daily.   dorzolamide-timolol 22.3-6.8 MG/ML ophthalmic solution Commonly known as:  COSOPT Place 1 drop into both eyes 2 (two) times daily.   furosemide 20 MG tablet Commonly known as:  LASIX Take 30 mg by mouth daily. What changed:  Another medication with the same name was removed. Continue taking this medication, and follow the directions you see here.   Ginkgo Biloba 60 MG Caps Take 1 capsule by mouth daily.   magnesium oxide 400 MG tablet Commonly known as:  MAG-OX Take 200  mg by mouth daily.   OXYGEN Inhale 2 L into the lungs continuous.   RISA-BID PROBIOTIC Tabs Take 1 tablet by mouth daily.   traMADol 50 MG tablet Commonly known as:  ULTRAM Take 25 mg by mouth at bedtime. 1/2 tablet   traMADol 50 MG tablet Commonly known as:  ULTRAM Take 50 mg by mouth every 6 (six) hours as needed.   vitamin B-12 100 MCG tablet Commonly known as:  CYANOCOBALAMIN Take 100 mcg by mouth daily.   Vitamin E 400 units Tabs Take 1 tablet by mouth daily.   Vitamin-B Complex Tabs Take 1 tablet by mouth daily.   ZINC-220 PO Take 1 tablet by mouth daily.       Today   VITAL SIGNS:  Blood pressure (!) 98/52, pulse 79, temperature 99.2 F (37.3 C), resp. rate 20, height 5\' 3"  (1.6 m), weight 80 kg (176  lb 5.9 oz), SpO2 91 %.  I/O:    Intake/Output Summary (Last 24 hours) at 12/31/2017 1526 Last data filed at 12/31/2017 0010 Gross per 24 hour  Intake 120 ml  Output 725 ml  Net -605 ml    PHYSICAL EXAMINATION:  Physical Exam  GENERAL:  84 y.o.-year-old patient lying in the bed with no acute distress.  LUNGS: Normal breath sounds bilaterally, no wheezing, rales,rhonchi or crepitation. No use of accessory muscles of respiration.  CARDIOVASCULAR: S1, S2 normal. No murmurs, rubs, or gallops.  ABDOMEN: Soft, non-tender, non-distended. Bowel sounds present. No organomegaly or mass.  NEUROLOGIC: Moves all 4 extremities. PSYCHIATRIC: The patient is pleasantly confused  DATA REVIEW:   CBC Recent Labs  Lab 12/30/17 0432  WBC 8.5  HGB 12.8  HCT 38.9  PLT 193    Chemistries  Recent Labs  Lab 12/27/17 2358 12/30/17 0432  NA 139 138  K 4.3 3.5  CL 101 103  CO2 26 29  GLUCOSE 176* 88  BUN 17 10  CREATININE 0.51 0.32*  CALCIUM 8.6* 7.9*  AST 30  --   ALT 24  --   ALKPHOS 73  --   BILITOT 1.0  --     Cardiac Enzymes No results for input(s): TROPONINI in the last 168 hours.  Microbiology Results  Results for orders placed or performed  during the hospital encounter of 12/27/17  Gastrointestinal Panel by PCR , Stool     Status: Abnormal   Collection Time: 12/27/17 11:41 PM  Result Value Ref Range Status   Campylobacter species NOT DETECTED NOT DETECTED Final   Plesimonas shigelloides NOT DETECTED NOT DETECTED Final   Salmonella species NOT DETECTED NOT DETECTED Final   Yersinia enterocolitica NOT DETECTED NOT DETECTED Final   Vibrio species NOT DETECTED NOT DETECTED Final   Vibrio cholerae NOT DETECTED NOT DETECTED Final   Enteroaggregative E coli (EAEC) NOT DETECTED NOT DETECTED Final   Enteropathogenic E coli (EPEC) NOT DETECTED NOT DETECTED Final   Enterotoxigenic E coli (ETEC) NOT DETECTED NOT DETECTED Final   Shiga like toxin producing E coli (STEC) NOT DETECTED NOT DETECTED Final   Shigella/Enteroinvasive E coli (EIEC) NOT DETECTED NOT DETECTED Final   Cryptosporidium NOT DETECTED NOT DETECTED Final   Cyclospora cayetanensis NOT DETECTED NOT DETECTED Final   Entamoeba histolytica NOT DETECTED NOT DETECTED Final   Giardia lamblia NOT DETECTED NOT DETECTED Final   Adenovirus F40/41 NOT DETECTED NOT DETECTED Final   Astrovirus NOT DETECTED NOT DETECTED Final   Norovirus GI/GII DETECTED (A) NOT DETECTED Final    Comment: RESULT CALLED TO, READ BACK BY AND VERIFIED WITH: VANESSA ASHLEY @ 0134 ON 12/28/2017 BYCAF    Rotavirus A NOT DETECTED NOT DETECTED Final   Sapovirus (I, II, IV, and V) NOT DETECTED NOT DETECTED Final    Comment: Performed at [facility], [address] [place], [place] [address]  C difficile quick scan w PCR reflex     Status: None   Collection Time: 12/27/17 11:59 PM  Result Value Ref Range Status   C Diff antigen NEGATIVE NEGATIVE Final   C Diff toxin NEGATIVE NEGATIVE Final   C Diff interpretation No C. difficile detected.  Final    Comment: VALID Performed at [facility], [address] [place], [place] [address]   MRSA PCR Screening     Status: None   Collection  Time: 12/28/17  5:41 AM  Result Value Ref Range Status   MRSA by PCR  NEGATIVE NEGATIVE Final    Comment:        The GeneXpert MRSA Assay (FDA approved for NASAL specimens only), is one component of a comprehensive MRSA colonization surveillance program. It is not intended to diagnose MRSA infection nor to guide or monitor treatment for MRSA infections. Performed at [facility], [address] [place], [place] [id number]     RADIOLOGY:  Dg Chest 1 View  Result Date: 12/29/2017 CLINICAL DATA:  Coarse crackles hypoxia EXAM: CHEST  1 VIEW COMPARISON:  07/08/2017 FINDINGS: Small pleural effusions. Bibasilar atelectasis or infiltrate. Borderline to mild cardiomegaly. Aortic atherosclerosis. No pneumothorax. IMPRESSION: 1. Small pleural effusions and bibasilar atelectasis or infiltrate 2. Stable borderline to mild cardiomegaly. Electronically Signed   By: Jasmine PangKim  Fujinaga M.D.   On: 12/29/2017 18:27    Follow up with PCP in 1 week.  Management plans discussed with the patient, family and they are in agreement.  CODE STATUS:  Code Status History    Date Active Date Inactive Code Status Order ID Comments User Context   12/28/2017 0442 12/31/2017 1521 Full Code [id number]  Arnaldo Nataliamond, Michael S, MD Inpatient   07/08/2017 0352 07/10/2017 2056 Full Code [id number]  Gery Prayrosley, Debby, MD ED    Advance Directive Documentation     Most Recent Value  Type of Advance Directive  Healthcare Power of Attorney  Pre-existing out of facility DNR order (yellow form or pink MOST form)  -  "MOST" Form in Place?  -      TOTAL TIME TAKING CARE OF THIS PATIENT ON DAY OF DISCHARGE: more than 30 minutes.   Molinda BailiffSrikar R Adolph Clutter M.D on 12/31/2017 at 3:26 PM  Between 7am to 6pm - Pager - [phone]  After 6pm go to [email or web address] - password [email or web address] [facility]  [facility] [place] [facility]  Office  [phone]  CC: Primary care physician; [name], [name], MD  Note: This dictation was prepared with Dragon  dictation along with smaller phrase technology. Any transcriptional errors that result from this process are unintentional.

## 2018-01-01 NOTE — Progress Notes (Signed)
New referral for out patient PALLIATIVE to follow at home [facility]. Pateint information faxed to referral. Dayna BarkerKaren Robertson RN,BSN, [facility] [place], hospital Liaison

## 2018-04-09 ENCOUNTER — Inpatient Hospital Stay
Admission: EM | Admit: 2018-04-09 | Discharge: 2018-04-12 | DRG: 534 | Disposition: A | Discharge status: Hospice | Payer: Medicare Other | Attending: Internal Medicine | Admitting: Internal Medicine

## 2018-04-09 ENCOUNTER — Other Ambulatory Visit: Payer: Self-pay

## 2018-04-09 ENCOUNTER — Emergency Department: Payer: Medicare Other

## 2018-04-09 DIAGNOSIS — H5462 Unqualified visual loss, left eye, normal vision right eye: Secondary | ICD-10-CM | POA: Diagnosis present

## 2018-04-09 DIAGNOSIS — A09 Infectious gastroenteritis and colitis, unspecified: Secondary | ICD-10-CM | POA: Diagnosis present

## 2018-04-09 DIAGNOSIS — R197 Diarrhea, unspecified: Secondary | ICD-10-CM

## 2018-04-09 DIAGNOSIS — M810 Age-related osteoporosis without current pathological fracture: Secondary | ICD-10-CM | POA: Diagnosis present

## 2018-04-09 DIAGNOSIS — F0390 Unspecified dementia without behavioral disturbance: Secondary | ICD-10-CM | POA: Diagnosis present

## 2018-04-09 DIAGNOSIS — Z66 Do not resuscitate: Secondary | ICD-10-CM | POA: Diagnosis present

## 2018-04-09 DIAGNOSIS — S72492A Other fracture of lower end of left femur, initial encounter for closed fracture: Secondary | ICD-10-CM

## 2018-04-09 DIAGNOSIS — H409 Unspecified glaucoma: Secondary | ICD-10-CM | POA: Diagnosis present

## 2018-04-09 DIAGNOSIS — Z885 Allergy status to narcotic agent status: Secondary | ICD-10-CM

## 2018-04-09 DIAGNOSIS — R627 Adult failure to thrive: Secondary | ICD-10-CM | POA: Diagnosis present

## 2018-04-09 DIAGNOSIS — Z7982 Long term (current) use of aspirin: Secondary | ICD-10-CM

## 2018-04-09 DIAGNOSIS — S72402A Unspecified fracture of lower end of left femur, initial encounter for closed fracture: Principal | ICD-10-CM | POA: Diagnosis present

## 2018-04-09 DIAGNOSIS — I69354 Hemiplegia and hemiparesis following cerebral infarction affecting left non-dominant side: Secondary | ICD-10-CM

## 2018-04-09 DIAGNOSIS — Z886 Allergy status to analgesic agent status: Secondary | ICD-10-CM

## 2018-04-09 DIAGNOSIS — Z515 Encounter for palliative care: Secondary | ICD-10-CM | POA: Diagnosis present

## 2018-04-09 DIAGNOSIS — S7292XA Unspecified fracture of left femur, initial encounter for closed fracture: Secondary | ICD-10-CM | POA: Diagnosis present

## 2018-04-09 DIAGNOSIS — W06XXXA Fall from bed, initial encounter: Secondary | ICD-10-CM | POA: Diagnosis present

## 2018-04-09 DIAGNOSIS — M9712XA Periprosthetic fracture around internal prosthetic left knee joint, initial encounter: Secondary | ICD-10-CM | POA: Diagnosis present

## 2018-04-09 DIAGNOSIS — Z79899 Other long term (current) drug therapy: Secondary | ICD-10-CM

## 2018-04-09 MED ORDER — MORPHINE SULFATE (PF) 2 MG/ML IV SOLN
2.0000 mg | Freq: Once | INTRAVENOUS | Status: AC
  Administered 2018-04-09: 2 mg via INTRAVENOUS

## 2018-04-09 MED ORDER — ONDANSETRON HCL 4 MG/2ML IJ SOLN
4.0000 mg | Freq: Once | INTRAMUSCULAR | Status: AC
  Administered 2018-04-09: 4 mg via INTRAVENOUS

## 2018-04-09 MED ORDER — ONDANSETRON HCL 4 MG/2ML IJ SOLN
INTRAMUSCULAR | Status: AC
  Administered 2018-04-09: 4 mg via INTRAVENOUS
  Filled 2018-04-09: qty 2

## 2018-04-09 MED ORDER — MORPHINE SULFATE (PF) 2 MG/ML IV SOLN
INTRAVENOUS | Status: AC
  Administered 2018-04-09: 2 mg via INTRAVENOUS
  Filled 2018-04-09: qty 1

## 2018-04-09 NOTE — ED Triage Notes (Signed)
Pt arrived via [facility] from home with c/o fall. EMS states pt was gettting out of bed and fell on left knee which has been replaced. EMS states no LOC and pt has been grabbing knee in pain.

## 2018-04-09 NOTE — ED Notes (Signed)
X-ray at bedside

## 2018-04-09 NOTE — ED Provider Notes (Addendum)
[facility] Emergency Department Provider Note  Time seen: 11:00 PM I have reviewed the triage vital signs and the nursing notes.   HISTORY  Chief Complaint Fall    HPI Kristin Graham is a 84 y.o. female with below list of chronic medical conditions presents to the emergency department with 10 out of 10 left knee pain which occurred after falling while getting out of bed.  EMS states patient had no loss of consciousness and she agrees to the same.  Patient states pain is worse with any movement of the left knee.  The other complaints.   Past Medical History:  Diagnosis Date  . Bilateral carpal tunnel syndrome    S/P surgery  . Depression    unspecified  . Foot fracture, right   . Glaucoma   . Left leg weakness    Progressive, evaluated by Ortho. LS spine MRI with enhancing mass involving L2 vertebra. oncology evaluation with Dr. Doylene Canning 6/11; bone scan negative; PET scan negative. 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 Neurosurgery consultation arranged by Oncology.  . Left shoulder tendinitis    with partial rotator cuff tear evaluated by Dr. Erin Sons  . Leg edema    likely venous insufficiency. Declines compression hose. Declines physical therapy. Echo 3/10 with normal heart function, mild MR.  . Osteoarthritis   . Osteoporosis, post-menopausal   . Stroke (HCC)    History of CVA versus TIA, 1996, with reported episodes of TIA since then. Left pontine CVA by MRI 12/14.  [name] Urinary incontinence   . Vision loss of left eye    followed by Dr. Fransico Michael. Possibly glaucoma-induced versus ischemic neuropathy from vascular disease.    Patient Active Problem List   Diagnosis Date Noted  . Femur fracture, left (HCC) 04/10/2018  . Gastroenteritis 12/28/2017  . Pressure injury of skin 12/28/2017  . Protein-calorie malnutrition (HCC) 07/26/2017  . Hip pain 07/09/2017  . Pelvic fracture (HCC) 07/08/2017  .  Cataract 07/08/2017  . CHF (congestive heart failure) (HCC) 07/08/2017  . Dyslipidemia 07/08/2017  . TIA (transient ischemic attack) 07/08/2017    Past Surgical History:  Procedure Laterality Date  . CARPAL TUNNEL RELEASE  2003  . CATARACT EXTRACTION, BILATERAL  2000  . CHOLECYSTECTOMY    . JOINT REPLACEMENT  07/2005   left total knee, Dr. Erin Sons    Prior to Admission medications   Medication Sig Start Date End Date Taking? Authorizing Provider  acetaminophen (TYLENOL) 325 MG tablet Take 650 mg by mouth every 4 (four) hours as needed.   Yes [provider]  furosemide (LASIX) 20 MG tablet Take 30 mg by mouth daily.    Yes [provider]  traMADol (ULTRAM) 50 MG tablet Take 50 mg by mouth every 6 (six) hours as needed.   Yes [provider]  acetaminophen (TYLENOL) 500 MG tablet Take 250 mg by mouth at bedtime. 1/2 tablet per home routine    [provider]  ascorbic acid (VITAMIN C) 1000 MG tablet Take 1,000 mg by mouth 2 (two) times daily.    [provider]  aspirin 325 MG tablet Take 325 mg by mouth daily.     [provider]  atorvastatin (LIPITOR) 10 MG tablet Take 10 mg by mouth daily.     [provider]  B Complex Vitamins (VITAMIN-B COMPLEX) TABS Take 1 tablet by mouth daily.    [provider]  bimatoprost (LUMIGAN) 0.03 %  ophthalmic solution Place 1 drop into both eyes at bedtime.     [provider]  calcium carbonate (OS-CAL - DOSED IN MG OF ELEMENTAL CALCIUM) 1250 (500 Ca) MG tablet Take 1 tablet by mouth 2 (two) times daily.    [provider]  camphor-menthol Wynelle Fanny(SARNA) lotion Apply 1 application topically daily as needed for itching. Apply to affected area    [provider]  Cranberry-Vitamin C-Vitamin E (CRANBERRY PLUS VITAMIN C) 4200-20-3 MG-MG-UNIT CAPS Take 1 capsule by mouth daily.    [provider]  docusate sodium (COLACE) 100 MG capsule Take 100 mg  by mouth daily.    [provider]  dorzolamide-timolol (COSOPT) 22.3-6.8 MG/ML ophthalmic solution Place 1 drop into both eyes 2 (two) times daily.     [provider]  Ginkgo Biloba 60 MG CAPS Take 1 capsule by mouth daily.    [provider]  magnesium oxide (MAG-OX) 400 MG tablet Take 200 mg by mouth daily.     [provider]  Multiple Vitamins-Minerals (CENTRUM SILVER) tablet Take 1 tablet by mouth daily.    [provider]  OXYGEN Inhale 2 L into the lungs continuous.    [provider]  Probiotic Product (RISA-BID PROBIOTIC) TABS Take 1 tablet by mouth daily.    [provider]  traMADol (ULTRAM) 50 MG tablet Take 25 mg by mouth at bedtime. 1/2 tablet    [provider]  vitamin B-12 (CYANOCOBALAMIN) 100 MCG tablet Take 100 mcg by mouth daily.    [provider]  Vitamin E 400 units TABS Take 1 tablet by mouth daily.    [provider]  Zinc Sulfate (ZINC-220 PO) Take 1 tablet by mouth daily.    [provider]    Allergies Bee venom; Chocolate; Nsaids; and Vicodin [hydrocodone-acetaminophen]  Family History  Problem Relation Age of Onset  . Osteoporosis Mother   . Rheum arthritis Mother   . Stroke Mother   . Cancer Father        stomach and rectal  . Osteoporosis Sister   . Osteoporosis Sister     Social History Social History   Tobacco Use  . Smoking status: Never Smoker  . Smokeless tobacco: Never Used  Substance Use Topics  . Alcohol use: No  . Drug use: No    Review of Systems Constitutional: No fever/chills Eyes: No visual changes. ENT: No sore throat. Cardiovascular: Denies chest pain. Respiratory: Denies shortness of breath. Gastrointestinal: No abdominal pain.  No nausea, no vomiting.  No diarrhea.  No constipation. Genitourinary: Negative for dysuria. Musculoskeletal: Negative for neck pain.  Negative for back pain.  Positive for left knee  pain Integumentary: Negative for rash. Neurological: Negative for headaches, focal weakness or numbness.  ____________________________________________   PHYSICAL EXAM:  VITAL SIGNS: ED Triage Vitals [04/09/18 2259]  Enc Vitals Group     BP 133/86     Pulse Rate (!) 108     Resp (!) 23     Temp 97.9 F (36.6 C)     Temp Source Oral     SpO2 99 %     Weight 63.5 kg (140 lb)     Height 1.626 m (5\' 4" )     Head Circumference      Peak Flow      Pain Score 10     Pain Loc      Pain Edu?      Excl. in GC?     Constitutional:  Alert and oriented.Apparent discomfort Eyes: Conjunctivae are normal. PERRL. EOMI. Head: Atraumatic. Mouth/Throat: Mucous membranes are moist. Oropharynx non-erythematous. Neck: No stridor.   Cardiovascular: Normal rate, regular rhythm. Good peripheral circulation. Grossly normal heart sounds. Respiratory: Normal respiratory effort.  No retractions. Lungs CTAB. Gastrointestinal: Soft and nontender. No distention.  Musculoskeletal: Left knee pain swelling.  Pain with active and passive range of motion. Neurologic:  Normal speech and language. No gross focal neurologic deficits are appreciated.  Skin:  Skin is warm, dry and intact. No rash noted. Psychiatric: Mood and affect are normal. Speech and behavior are normal.  ____________________________________________   LABS (all labs ordered are listed, but only abnormal results are displayed)  Labs Reviewed  CBC - Abnormal; Notable for the following components:      Result Value   WBC 14.0 (*)    All other components within normal limits  COMPREHENSIVE METABOLIC PANEL - Abnormal; Notable for the following components:   Glucose, Bld 140 (*)    Creatinine, Ser 0.31 (*)    Calcium 8.8 (*)    Total Protein 6.3 (*)    Albumin 3.1 (*)    All other components within normal limits  GASTROINTESTINAL PANEL BY PCR, STOOL (REPLACES STOOL CULTURE)  TSH   ________________________________  RADIOLOGY I,  Hayden N Andranik Jeune, personally viewed and evaluated these images (plain radiographs) as part of my medical decision making, as well as reviewing the written report by the radiologist.  ED MD interpretation: Spiral nondisplaced angulated distal femur fracture just superior to arthroplasty noted on knee xray  Official radiology report(s): Ct Femur Left Wo Contrast  Result Date: 04/10/2018 CLINICAL DATA:  84 year old female with left femur fracture. EXAM: CT OF THE LOWER LEFT EXTREMITY WITHOUT CONTRAST TECHNIQUE: Multidetector CT imaging of the lower left extremity was performed according to the standard protocol. COMPARISON:  Radiograph of the left knee dated 04/09/2018 FINDINGS: Bones/Joint/Cartilage There is a displaced oblique fracture of the distal femur with approximately 1 cm distraction gap and mild dorsal angulation and displacement of the distal fracture fragment. The fracture extends to the superior aspect of the femoral component of the arthroplasty. Evaluation of the distal femur and arthroplasty is limited due to advanced osteopenia and associated streak artifact from the metallic orthopedic hardware. There is no dislocation. No large joint effusion. Ligaments Suboptimally assessed by CT. Muscles and Tendons Small amount of hematoma noted in the distal quadriceps. Soft tissues No acute findings.  Vascular calcification. IMPRESSION: Mildly displaced and posteriorly angulated fracture of the distal femur with extension to the femoral component of the arthroplasty. There is advanced osteopenia and streak artifact caused by metallic hardware which limit evaluation of the fracture and orthopedic hardware. Electronically Signed   By: Elgie Collard M.D.   On: 04/10/2018 02:32   Dg Knee Complete 4 Views Left  Result Date: 04/09/2018 CLINICAL DATA:  84 year old female with fall and left knee pain. EXAM: LEFT KNEE - COMPLETE 4+ VIEW COMPARISON:  Left knee radiograph dated 08/09/2005 FINDINGS: There is a  mildly displaced spiral fracture of the distal femoral diaphysis with mild dorsal angulation of the distal fracture fragment. The fracture extends to the superior aspect of the femoral component of the arthroplasty as well as probable extension of another fracture fragment into the midportion of the arthroplasty. There is no dislocation. There is advanced osteopenia. No significant joint effusion. IMPRESSION: 1. Mildly displaced and angulated spiral fracture of the distal femur with extension to the femoral component of the arthroplasty. No dislocation. 2.  Advanced osteopenia. Electronically Signed   By: Elgie Collard M.D.   On: 04/09/2018 23:27    ____________________________________________   Procedures   ____________________________________________   INITIAL IMPRESSION / ASSESSMENT AND PLAN / ED COURSE  As part of my medical decision making, I reviewed the following data within the electronic MEDICAL RECORD NUMBER   84 year old female presenting with above-stated history and physical exam following accidental fall.  Knee x-ray revealed a spiral angulated and displaced distal femur fracture just superior to the patient's arthroplasty.  Patient discussed with Dr.Patel  Orthopedic surgery call who recommended hospitalist admission.  Patient subsequently discussed with Dr. Sheryle Hail for hospital admission for further evaluation and management.  Family informed me  that the patient has had  diarrhea and the patient had a diarrheal episode while in the emergency department and as such a sample was obtain.  Patient was given IV morphine 2 mg in the emergency department with pain improvement. ____________________________________________  FINAL CLINICAL IMPRESSION(S) / ED DIAGNOSES  Final diagnoses:  Other closed fracture of distal end of left femur, initial encounter (HCC)  Diarrhea of presumed infectious origin     MEDICATIONS GIVEN DURING THIS VISIT:  Medications  acetaminophen (TYLENOL)  tablet 650 mg (has no administration in time range)    Or  acetaminophen (TYLENOL) suppository 650 mg (has no administration in time range)  ondansetron (ZOFRAN) tablet 4 mg (has no administration in time range)    Or  ondansetron (ZOFRAN) injection 4 mg (has no administration in time range)  docusate sodium (COLACE) capsule 100 mg (has no administration in time range)  morphine 2 MG/ML injection 2 mg (has no administration in time range)  morphine 2 MG/ML injection (has no administration in time range)  morphine 2 MG/ML injection 2 mg (2 mg Intravenous Given 04/09/18 2313)  ondansetron (ZOFRAN) injection 4 mg (4 mg Intravenous Given 04/09/18 2313)     ED Discharge Orders    None       Note:  This document was prepared using Dragon voice recognition software and may include unintentional dictation errors.    Darci Current, MD 04/10/18 1610    Darci Current, MD 04/10/18 [phone]

## 2018-04-10 ENCOUNTER — Emergency Department: Payer: Medicare Other

## 2018-04-10 ENCOUNTER — Encounter: Payer: Self-pay | Admitting: Registered Nurse

## 2018-04-10 DIAGNOSIS — S7292XA Unspecified fracture of left femur, initial encounter for closed fracture: Secondary | ICD-10-CM | POA: Diagnosis present

## 2018-04-10 DIAGNOSIS — R627 Adult failure to thrive: Secondary | ICD-10-CM | POA: Diagnosis present

## 2018-04-10 DIAGNOSIS — Z79899 Other long term (current) drug therapy: Secondary | ICD-10-CM | POA: Diagnosis not present

## 2018-04-10 DIAGNOSIS — A09 Infectious gastroenteritis and colitis, unspecified: Secondary | ICD-10-CM | POA: Diagnosis present

## 2018-04-10 DIAGNOSIS — Z7982 Long term (current) use of aspirin: Secondary | ICD-10-CM | POA: Diagnosis not present

## 2018-04-10 DIAGNOSIS — M9712XA Periprosthetic fracture around internal prosthetic left knee joint, initial encounter: Secondary | ICD-10-CM | POA: Diagnosis present

## 2018-04-10 DIAGNOSIS — H409 Unspecified glaucoma: Secondary | ICD-10-CM | POA: Diagnosis present

## 2018-04-10 DIAGNOSIS — Z886 Allergy status to analgesic agent status: Secondary | ICD-10-CM | POA: Diagnosis not present

## 2018-04-10 DIAGNOSIS — I69354 Hemiplegia and hemiparesis following cerebral infarction affecting left non-dominant side: Secondary | ICD-10-CM | POA: Diagnosis not present

## 2018-04-10 DIAGNOSIS — Z66 Do not resuscitate: Secondary | ICD-10-CM | POA: Diagnosis not present

## 2018-04-10 DIAGNOSIS — F0390 Unspecified dementia without behavioral disturbance: Secondary | ICD-10-CM | POA: Diagnosis present

## 2018-04-10 DIAGNOSIS — Z885 Allergy status to narcotic agent status: Secondary | ICD-10-CM | POA: Diagnosis not present

## 2018-04-10 DIAGNOSIS — R197 Diarrhea, unspecified: Secondary | ICD-10-CM | POA: Diagnosis present

## 2018-04-10 DIAGNOSIS — Z7189 Other specified counseling: Secondary | ICD-10-CM | POA: Diagnosis not present

## 2018-04-10 DIAGNOSIS — S72402A Unspecified fracture of lower end of left femur, initial encounter for closed fracture: Secondary | ICD-10-CM | POA: Diagnosis present

## 2018-04-10 DIAGNOSIS — S72492A Other fracture of lower end of left femur, initial encounter for closed fracture: Secondary | ICD-10-CM | POA: Diagnosis not present

## 2018-04-10 DIAGNOSIS — W06XXXA Fall from bed, initial encounter: Secondary | ICD-10-CM | POA: Diagnosis present

## 2018-04-10 DIAGNOSIS — M810 Age-related osteoporosis without current pathological fracture: Secondary | ICD-10-CM | POA: Diagnosis present

## 2018-04-10 DIAGNOSIS — H5462 Unqualified visual loss, left eye, normal vision right eye: Secondary | ICD-10-CM | POA: Diagnosis present

## 2018-04-10 DIAGNOSIS — Z515 Encounter for palliative care: Secondary | ICD-10-CM | POA: Diagnosis not present

## 2018-04-10 LAB — GASTROINTESTINAL PANEL BY PCR, STOOL (REPLACES STOOL CULTURE)

## 2018-04-10 LAB — COMPREHENSIVE METABOLIC PANEL WITH GFR
ALT: 15 U/L (ref 0–44)
AST: 21 U/L (ref 15–41)
Albumin: 3.1 g/dL — ABNORMAL LOW (ref 3.5–5.0)
Alkaline Phosphatase: 55 U/L (ref 38–126)
Anion gap: 8 (ref 5–15)
BUN: 11 mg/dL (ref 8–23)
CO2: 32 mmol/L (ref 22–32)
Calcium: 8.8 mg/dL — ABNORMAL LOW (ref 8.9–10.3)
Chloride: 102 mmol/L (ref 98–111)
Creatinine, Ser: 0.31 mg/dL — ABNORMAL LOW (ref 0.44–1.00)
GFR calc Af Amer: >60 mL/min (ref >60)
GFR calc non Af Amer: >60 mL/min (ref >60)
Glucose, Bld: 140 mg/dL — ABNORMAL HIGH (ref 70–99)
Potassium: 3.6 mmol/L (ref 3.5–5.1)
Sodium: 142 mmol/L (ref 135–145)
Total Bilirubin: 1 mg/dL (ref 0.3–1.2)
Total Protein: 6.3 g/dL — ABNORMAL LOW (ref 6.5–8.1)

## 2018-04-10 LAB — CBC
HCT: 45.9 % (ref 35.0–47.0)
Hemoglobin: 14.9 g/dL (ref 12.0–16.0)
MCH: 29.8 pg (ref 26.0–34.0)
MCHC: 32.5 g/dL (ref 32.0–36.0)
MCV: 91.6 fL (ref 80.0–100.0)
Platelets: 245 K/uL (ref 150–440)
RBC: 5.01 MIL/uL (ref 3.80–5.20)
RDW: 12.7 % (ref 11.5–14.5)
WBC: 14 K/uL — ABNORMAL HIGH (ref 3.6–11.0)

## 2018-04-10 LAB — TSH: TSH: 0.43 u[IU]/mL (ref 0.350–4.500)

## 2018-04-10 MED ORDER — ONDANSETRON HCL 4 MG/2ML IJ SOLN: 4.0000 mg | Freq: Four times a day (QID) | INTRAMUSCULAR | Status: DC | PRN

## 2018-04-10 MED ORDER — DOCUSATE SODIUM 100 MG PO CAPS
100.0000 mg | ORAL_CAPSULE | Freq: Two times a day (BID) | ORAL | Status: DC
  Administered 2018-04-10 – 2018-04-12 (×4): 100 mg via ORAL
  Filled 2018-04-10 (×5): qty 1

## 2018-04-10 MED ORDER — FUROSEMIDE 20 MG PO TABS
30.0000 mg | ORAL_TABLET | Freq: Every day | ORAL | Status: DC
  Administered 2018-04-11 – 2018-04-12 (×2): 30 mg via ORAL
  Filled 2018-04-10 (×2): qty 2

## 2018-04-10 MED ORDER — TRAMADOL HCL 50 MG PO TABS
50.0000 mg | ORAL_TABLET | Freq: Four times a day (QID) | ORAL | Status: DC | PRN
  Administered 2018-04-10 – 2018-04-11 (×3): 50 mg via ORAL
  Filled 2018-04-10 (×3): qty 1

## 2018-04-10 MED ORDER — ACETAMINOPHEN 325 MG PO TABS
650.0000 mg | ORAL_TABLET | Freq: Four times a day (QID) | ORAL | Status: DC | PRN
  Administered 2018-04-10 – 2018-04-12 (×6): 650 mg via ORAL
  Filled 2018-04-10 (×6): qty 2

## 2018-04-10 MED ORDER — ACETAMINOPHEN 650 MG RE SUPP: 650.0000 mg | Freq: Four times a day (QID) | RECTAL | Status: DC | PRN

## 2018-04-10 MED ORDER — MORPHINE SULFATE (PF) 2 MG/ML IV SOLN: 2.0000 mg | INTRAVENOUS | Status: DC | PRN

## 2018-04-10 MED ORDER — MORPHINE SULFATE (PF) 2 MG/ML IV SOLN
INTRAVENOUS | Status: AC
  Filled 2018-04-10: qty 1

## 2018-04-10 MED ORDER — ONDANSETRON HCL 4 MG PO TABS: 4.0000 mg | ORAL_TABLET | Freq: Four times a day (QID) | ORAL | Status: DC | PRN

## 2018-04-10 NOTE — Anesthesia Preprocedure Evaluation (Deleted)
Anesthesia Evaluation  Patient identified by MRN, date of birth, ID band  Airway        Dental   Pulmonary           Cardiovascular + Peripheral Vascular Disease and +CHF       Neuro/Psych PSYCHIATRIC DISORDERS Depression TIA Neuromuscular disease CVA    GI/Hepatic Neg liver ROS, GERD  ,  Endo/Other    Renal/GU negative Renal ROS     Musculoskeletal  (+) Arthritis , Osteoarthritis,    Abdominal   Peds  Hematology negative hematology ROS (+)   Anesthesia Other Findings L2 vertebral mass thought possibly to be osteophyte vs. Other..  Neurosurgery and oncology evaluating.  Past Medical History: No date: Bilateral carpal tunnel syndrome     Comment:  S/P surgery No date: Depression     Comment:  unspecified No date: Foot fracture, right No date: Glaucoma No date: Left leg weakness     Comment:  Progressive, evaluated by Ortho. LS spine MRI with               enhancing mass involving L2 vertebra. oncology evaluation              with Dr. Doylene Canninghoksi 6/11; bone scan negative; PET scan               negative. Mass reviewed at Tumor Conference and felt may               represent large bridging osteophyte. Neurosurgery               consultation arranged by Oncology. No date: Left shoulder tendinitis     Comment:  with partial rotator cuff tear evaluated by Dr. Erin SonsHarold               [facility] No date: Leg edema     Comment:  likely venous insufficiency. Declines compression hose.               Declines physical therapy. Echo 3/10 with normal heart               function, mild MR. No date: Osteoarthritis No date: Osteoporosis, post-menopausal No date: Stroke [facility](HCC)     Comment:  History of CVA versus TIA, 1996, with reported episodes               of TIA since then. Left pontine CVA by MRI 12/14. No date: Urinary incontinence No date: Vision loss of left eye     Comment:  followed by Dr. Fransico MichaelBrennan. Possibly glaucoma-induced                versus ischemic neuropathy from vascular disease.  Reproductive/Obstetrics                             Anesthesia Physical Anesthesia Plan Anesthesia Quick Evaluation

## 2018-04-10 NOTE — Progress Notes (Signed)
She was seen and examined.  Resting comfortably.  Seen by Dr. Signa KellSunny Patel Patient's daughter is asking Dr. Ernest PineHooten to do surgery.  Dr. Ernest PineHooten will see the patient and in the interim patient diet resumed.  Dr.Hooten is considering to do surgery tomorrow after talking to the family

## 2018-04-10 NOTE — Consult Note (Signed)
ORTHOPAEDIC CONSULTATION  REQUESTING PHYSICIAN: Ramonita LabGouru, Aruna, MD  Chief Complaint:   L knee/femur pain  History of Present Illness: History obtained from patient and from talking to daughter Kristin Graham(PoA and primary caretaker), Kristin DresserConnie at [phone].  Kristin Graham is a 84 y.o. female who had a fall yesterday when she tried to get up on her own.  The patient heard a pop and had severe 10/10 pain afterwards. Pain is over distal femur. The patient has had a TKA done by Dr. Gavin PottersKernodle in October 2006 after fixation of a distal femur fracture that was sustained in the 1990s. She had a R minimally displaced superior pubic ramus fracture sustained on 07/08/18. She has been non-ambulatory independently since that time. She gets up with the assistance of PT at home. She needs a hoyer lift, which she has at home as well. Daugther states that patient is in bed most of the day, but she is able to help the patient with ADLs throughout the day as she stays with the patient. Daughter also states that patient's mental status waxes and wanes and usually realizes she cannot get up on her own but did not realize this yesterday when she fell. Patient is also not competent to make own decisions.  X-rays/CT scan in the emergency department show a left intertrochanteric hip fracture. She was placed in a knee immobilizer and admitted to Hospitalist service. She also has had some diarrhea for which GI workup is ongoing.   Past Medical History:  Diagnosis Date  . Bilateral carpal tunnel syndrome    S/P surgery  . Depression    unspecified  . Foot fracture, right   . Glaucoma   . Left leg weakness    Progressive, evaluated by Ortho. LS spine MRI with enhancing mass involving L2 vertebra. oncology evaluation with Dr. Doylene Canninghoksi 6/11; bone scan negative; PET scan negative. 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 Neurosurgery  consultation arranged by Oncology.  . Left shoulder tendinitis    with partial rotator cuff tear evaluated by Dr. Erin SonsHarold [facility]ernodle  . Leg edema    likely venous insufficiency. Declines compression hose. Declines physical therapy. Echo 3/10 with normal heart function, mild MR.  . Osteoarthritis   . Osteoporosis, post-menopausal   . Stroke (HCC)    History of CVA versus TIA, 1996, with reported episodes of TIA since then. Left pontine CVA by MRI 12/14.  [name]. Urinary incontinence   . Vision loss of left eye    followed by Dr. Fransico MichaelBrennan. Possibly glaucoma-induced versus ischemic neuropathy from vascular disease.   Past Surgical History:  Procedure Laterality Date  . CARPAL TUNNEL RELEASE  2003  . CATARACT EXTRACTION, BILATERAL  2000  . CHOLECYSTECTOMY    . JOINT REPLACEMENT  07/2005   left total knee, Dr. Erin SonsHarold Kernodle   Social History   Socioeconomic History  . Marital status: Widowed    Spouse name: Not on file  . Number of children: 2  . Years of education: Not on file  . Highest education level: Not on file  Occupational History  . Not on file  Social Needs  . Financial resource strain: Not on file  . Food insecurity:    Worry: Not on file    Inability: Not on file  . Transportation needs:    Medical: Not on file    Non-medical: Not on file  Tobacco Use  . Smoking status: Never Smoker  . Smokeless tobacco: Never Used  Substance and  Sexual Activity  . Alcohol use: No  . Drug use: No  . Sexual activity: Not on file  Lifestyle  . Physical activity:    Days per week: Not on file    Minutes per session: Not on file  . Stress: Not on file  Relationships  . Social connections:    Talks on phone: Not on file    Gets together: Not on file    Attends religious service: Not on file    Active member of club or organization: Not on file    Attends meetings of clubs or organizations: Not on file    Relationship status: Not on file  Other Topics Concern  . Not on file   Social History Narrative   Full Code   Never smoker   Denies alcohol use   Widowed    2 children   Daughter is HPCOA   Family History  Problem Relation Age of Onset  . Osteoporosis Mother   . Rheum arthritis Mother   . Stroke Mother   . Cancer Father        stomach and rectal  . Osteoporosis Sister   . Osteoporosis Sister    Allergies  Allergen Reactions  . Bee Venom Swelling  . Chocolate   . Nsaids   . Vicodin [Hydrocodone-Acetaminophen]    Prior to Admission medications   Medication Sig Start Date End Date Taking? Authorizing Provider  acetaminophen (TYLENOL) 325 MG tablet Take 650 mg by mouth every 4 (four) hours as needed.   Yes [provider]  furosemide (LASIX) 20 MG tablet Take 30 mg by mouth daily.    Yes [provider]  traMADol (ULTRAM) 50 MG tablet Take 50 mg by mouth every 6 (six) hours as needed.   Yes [provider]  acetaminophen (TYLENOL) 500 MG tablet Take 250 mg by mouth at bedtime. 1/2 tablet per home routine    [provider]  ascorbic acid (VITAMIN C) 1000 MG tablet Take 1,000 mg by mouth 2 (two) times daily.    [provider]  aspirin 325 MG tablet Take 325 mg by mouth daily.     [provider]  atorvastatin (LIPITOR) 10 MG tablet Take 10 mg by mouth daily.     [provider]  B Complex Vitamins (VITAMIN-B COMPLEX) TABS Take 1 tablet by mouth daily.    [provider]  bimatoprost (LUMIGAN) 0.03 % ophthalmic solution Place 1 drop into both eyes at bedtime.     [provider]  calcium carbonate (OS-CAL - DOSED IN MG OF ELEMENTAL CALCIUM) 1250 (500 Ca) MG tablet Take 1 tablet by mouth 2 (two) times daily.    [provider]  camphor-menthol Wynelle Fanny) lotion Apply 1 application topically daily as needed for itching. Apply to affected area    [provider]  Cranberry-Vitamin C-Vitamin E (CRANBERRY PLUS VITAMIN C) 4200-20-3 MG-MG-UNIT CAPS Take 1 capsule  by mouth daily.    [provider]  docusate sodium (COLACE) 100 MG capsule Take 100 mg by mouth daily.    [provider]  dorzolamide-timolol (COSOPT) 22.3-6.8 MG/ML ophthalmic solution Place 1 drop into both eyes 2 (two) times daily.     [provider]  Ginkgo Biloba 60 MG CAPS Take 1 capsule by mouth daily.    [provider]  magnesium oxide (MAG-OX) 400 MG tablet Take 200 mg by mouth daily.     [provider]  Multiple Vitamins-Minerals (CENTRUM SILVER) tablet Take  1 tablet by mouth daily.    [provider]  OXYGEN Inhale 2 L into the lungs continuous.    [provider]  Probiotic Product (RISA-BID PROBIOTIC) TABS Take 1 tablet by mouth daily.    [provider]  traMADol (ULTRAM) 50 MG tablet Take 25 mg by mouth at bedtime. 1/2 tablet    [provider]  vitamin B-12 (CYANOCOBALAMIN) 100 MCG tablet Take 100 mcg by mouth daily.    [provider]  Vitamin E 400 units TABS Take 1 tablet by mouth daily.    [provider]  Zinc Sulfate (ZINC-220 PO) Take 1 tablet by mouth daily.    [provider]   Recent Labs    04/10/18 0046  WBC 14.0*  HGB 14.9  HCT 45.9  PLT 245  K 3.6  CL 102  CO2 32  BUN 11  CREATININE 0.31*  GLUCOSE 140*  CALCIUM 8.8*   Ct Femur Left Wo Contrast  Result Date: 04/10/2018 CLINICAL DATA:  84 year old female with left femur fracture. EXAM: CT OF THE LOWER LEFT EXTREMITY WITHOUT CONTRAST TECHNIQUE: Multidetector CT imaging of the lower left extremity was performed according to the standard protocol. COMPARISON:  Radiograph of the left knee dated 04/09/2018 FINDINGS: Bones/Joint/Cartilage There is a displaced oblique fracture of the distal femur with approximately 1 cm distraction gap and mild dorsal angulation and displacement of the distal fracture fragment. The fracture extends to the superior aspect of the femoral component of the arthroplasty.  Evaluation of the distal femur and arthroplasty is limited due to advanced osteopenia and associated streak artifact from the metallic orthopedic hardware. There is no dislocation. No large joint effusion. Ligaments Suboptimally assessed by CT. Muscles and Tendons Small amount of hematoma noted in the distal quadriceps. Soft tissues No acute findings.  Vascular calcification. IMPRESSION: Mildly displaced and posteriorly angulated fracture of the distal femur with extension to the femoral component of the arthroplasty. There is advanced osteopenia and streak artifact caused by metallic hardware which limit evaluation of the fracture and orthopedic hardware. Electronically Signed   By: Elgie Collard M.D.   On: 04/10/2018 02:32   Dg Knee Complete 4 Views Left  Result Date: 04/09/2018 CLINICAL DATA:  84 year old female with fall and left knee pain. EXAM: LEFT KNEE - COMPLETE 4+ VIEW COMPARISON:  Left knee radiograph dated 08/09/2005 FINDINGS: There is a mildly displaced spiral fracture of the distal femoral diaphysis with mild dorsal angulation of the distal fracture fragment. The fracture extends to the superior aspect of the femoral component of the arthroplasty as well as probable extension of another fracture fragment into the midportion of the arthroplasty. There is no dislocation. There is advanced osteopenia. No significant joint effusion. IMPRESSION: 1. Mildly displaced and angulated spiral fracture of the distal femur with extension to the femoral component of the arthroplasty. No dislocation. 2. Advanced osteopenia. Electronically Signed   By: Elgie Collard M.D.   On: 04/09/2018 23:27     Positive ROS: All other systems have been reviewed and were otherwise negative with the exception of those mentioned in the HPI and as above.  Physical Exam: BP 127/72 (BP Location: Right Arm)   Pulse 96   Temp 97.7 F (36.5 C) (Oral)   Resp 17   Ht 5\' 4"  (1.626 m)   Wt 74.8 kg (165 lb)   SpO2 99%    BMI 28.32 kg/m  General:  Alert, no acute distress Psychiatric:  Patient is NOT competent for consent,  normal mood and affect   Cardiovascular:  No pedal edema, regular rate and rhythm Respiratory:  No wheezing, non-labored breathing GI:  Abdomen is soft and non-tender Skin:  No lesions in the area of chief complaint, no erythema Neurologic:  Sensation intact distally, CN grossly intact Lymphatic:  No axillary or cervical lymphadenopathy  Orthopedic Exam:  LLE: + DF/PF/EHL SILT grossly over foot Foot wwp + ttp over distal femur with appropriate soft tissue swelling   X-rays/CT:  As above: L periprosthetic distal femur fracture  Assessment/Plan: SONNIA STRONG is a 84 y.o. female with a L periprosthetic distal femur fracture 1. I discussed the various treatment options including both surgical and non-surgical management of her fracture with the patient's daughter, Kristin Graham (medical PoA and primary caretaker). We discussed the high risk of non-operative management as well as perioperative complications due to patient's age, dementia, and other co-morbidities. The daughter states that if surgery were to be performed, she only would want Dr. Ernest Pine to perform it. She understands that this may delay her mother's care, given Dr. Elenor Legato schedule.  2. I have discussed her case with Dr. Ernest Pine who will plan to see the patient and daughter later today with tentative plan for surgery tomorrow, 04/11/18.  3. Hold anticoagulation in advance of OR 4. Keep NPO until plans for OR are finalized. 5. Will need clearance from Hospitalist team and Anesthesia team.

## 2018-04-10 NOTE — Clinical Social Work Note (Signed)
Clinical Social Work Assessment  Patient Details  Name: DYLLAN KATS MRN: [id number] Date of Birth: 1916/05/04  Date of referral:  04/10/18               Reason for consult:  Facility Placement                Permission sought to share information with:  Chartered certified accountant granted to share information::  Yes, Verbal Permission Granted  Name::      [place]::   [place]   Relationship::     Contact Information:     Housing/Transportation Living arrangements for the past 2 months:  [place] of Information:  Patient, Adult Children Patient Interpreter Needed:  None Criminal Activity/Legal Involvement Pertinent to Current Situation/Hospitalization:  No - Comment as needed Significant Relationships:  Adult Children Lives with:  Adult Children Do you feel safe going back to the place where you live?  Yes Need for family participation in patient care:  Yes (Comment)  Care giving concerns:  Patient lives in [place] and her daughter Marlowe Kays spends the night with her.    Social Worker assessment / plan:  Holiday representative ([place]) reviewed chart and noted that patient has a hip fracture. Surgery and PT are pending. CSW met with patient alone at bedside today. Patient was oriented X2. Per patient she lives in [place] and her daughter Marlowe Kays is her caregiver and [name]'s significant other Josph Macho is also supportive. CSW discussed the need for possible SNF placement with patient. Patient is agreeable to SNF placement and reported that Marlowe Kays would make the decision about which facility she would go to. CSW contacted patient's daughter Marlowe Kays to complete assessment. Per daughter she has been patient's caregiver for 18 years and was going to throw patient a party for her 89th birthday coming up. Per daughter patient lives alone and daughter lives in a house close by. Per daughter she stays with patient every night and  provides care throughout the day. Per daughter her significant other Fred stays with patient while Marlowe Kays can't be there. CSW explained SNF process and that [facility] will have to approve it. CSW explained that if [facility] does not approve SNF then patient would have to pay out of pocket for SNF or go home with home health. Daughter is agreeable to SNF search and prefers [facility] or [facility]. FL2 complete and faxed out. CSW will continue to follow and assist as needed.   Employment status:  Disabled (Comment on whether or not currently receiving Disability), Retired Nurse, adult PT Recommendations:  Not assessed at this time Information / Referral to community resources:  [place]  Patient/Family's Response to care:  Patient and her daughter are agreeable to SNF search in [place].   Patient/Family's Understanding of and Emotional Response to Diagnosis, Current Treatment, and Prognosis:  Patient and her daughter were very pleasant and thanked CSW for assistance.   Emotional Assessment Appearance:  Appears stated age Attitude/Demeanor/Rapport:    Affect (typically observed):  Accepting, Adaptable, Pleasant Orientation:  Oriented to Self, Oriented to Place, Oriented to  Time, Oriented to Situation Alcohol / Substance use:  Not Applicable Psych involvement (Current and /or in the community):  No (Comment)  Discharge Needs  Concerns to be addressed:  Discharge Planning Concerns Readmission within the last 30 days:  No Current discharge risk:  Dependent with Mobility Barriers to Discharge:  Continued Medical  Work up   [employer], [employer], LCSW 04/10/2018, 3:20 PM

## 2018-04-10 NOTE — ED Notes (Addendum)
1 mg of Morphine given IV per MD Sheryle Hailiamond verbal order

## 2018-04-10 NOTE — Clinical Social Work Placement (Signed)
   CLINICAL SOCIAL WORK PLACEMENT  NOTE  Date:  04/10/2018  Patient Details  Name: Kristin Graham MRN: [id number] Date of Birth: August 14, 1916  Clinical Social Work is seeking post-discharge placement for this patient at the Skilled  Nursing Facility level of care (*CSW will initial, date and re-position this form in  chart as items are completed):  Yes   Patient/family provided with [facility] Clinical Social Work Department's list of facilities offering this level of care within the geographic area requested by the patient (or if unable, by the patient's family).  Yes   Patient/family informed of their freedom to choose among providers that offer the needed level of care, that participate in Medicare, Medicaid or managed care program needed by the patient, have an available bed and are willing to accept the patient.  Yes   Patient/family informed of [facility]'s ownership interest in [facility] and [facility], as well as of the fact that they are under no obligation to receive care at these facilities.  PASRR submitted to EDS on       PASRR number received on       Existing PASRR number confirmed on 04/10/18     FL2 transmitted to all facilities in geographic area requested by pt/family on 04/10/18     FL2 transmitted to all facilities within larger geographic area on       Patient informed that his/her managed care company has contracts with or will negotiate with certain facilities, including the following:            Patient/family informed of bed offers received.  Patient chooses bed at       Physician recommends and patient chooses bed at      Patient to be transferred to   on  .  Patient to be transferred to facility by       Patient family notified on   of transfer.  Name of family member notified:        PHYSICIAN       Additional Comment:    _______________________________________________ Alanny Rivers, Darleen CrockerBailey M, LCSW 04/10/2018, 3:19 PM

## 2018-04-10 NOTE — [place] FL2 (Signed)
[place] MEDICAID [id number] LEVEL OF CARE SCREENING TOOL     IDENTIFICATION  Patient Name: Kristin Graham Birthdate: 1916/04/23 Sex: female Admission Date (Current Location): 04/09/2018  [place] and [place] Number:  Chiropodist and Address:  [facility], [address] [place], [place] [address]      Provider Number: [id number]  Attending Physician Name and Address:  Ramonita Lab, MD  Relative Name and Phone Number:       Current Level of Care: Hospital Recommended Level of Care: Skilled Nursing Facility Prior Approval Number:    Date Approved/Denied:   PASRR Number: [id number])  Discharge Plan: SNF    Current Diagnoses: Patient Active Problem List   Diagnosis Date Noted  . Femur fracture, left (HCC) 04/10/2018  . Gastroenteritis 12/28/2017  . Pressure injury of skin 12/28/2017  . Protein-calorie malnutrition (HCC) 07/26/2017  . Hip pain 07/09/2017  . Pelvic fracture (HCC) 07/08/2017  . Cataract 07/08/2017  . CHF (congestive heart failure) (HCC) 07/08/2017  . Dyslipidemia 07/08/2017  . TIA (transient ischemic attack) 07/08/2017    Orientation RESPIRATION BLADDER Height & Weight     Time, Place  O2(2 Liters Oxygen. ) Continent Weight: 165 lb (74.8 kg) Height:  5\' 4"  (162.6 cm)  BEHAVIORAL SYMPTOMS/MOOD NEUROLOGICAL BOWEL NUTRITION STATUS      Continent Diet(Diet: NPO for surgery to be advanced. )  AMBULATORY STATUS COMMUNICATION OF NEEDS Skin   Extensive Assist Verbally Surgical wounds                       Personal Care Assistance Level of Assistance  Bathing, Feeding, Dressing Bathing Assistance: Maximum assistance Feeding assistance: Limited assistance Dressing Assistance: Maximum assistance     Functional Limitations Info  Sight, Hearing, Speech Sight Info: Adequate Hearing Info: Adequate Speech Info: Adequate    SPECIAL CARE FACTORS FREQUENCY  PT (By licensed PT), OT (By licensed OT)     PT Frequency:  (5) OT Frequency: (5)            Contractures      Additional Factors Info  Code Status, Allergies Code Status Info: (DNR ) Allergies Info: (Bee Venom, Chocolate, Nsaids, Vicodin Hydrocodone- acetaminophen)           Current Medications (04/10/2018):  This is the current hospital active medication list Current Facility-Administered Medications  Medication Dose Route Frequency Provider Last Rate Last Dose  . acetaminophen (TYLENOL) tablet 650 mg  650 mg Oral Q6H PRN Arnaldo Natal, MD   650 mg at 04/10/18 1046   Or  . acetaminophen (TYLENOL) suppository 650 mg  650 mg Rectal Q6H PRN Arnaldo Natal, MD      . docusate sodium (COLACE) capsule 100 mg  100 mg Oral BID Arnaldo Natal, MD   100 mg at 04/10/18 1046  . morphine 2 MG/ML injection 2 mg  2 mg Intravenous Q4H PRN Arnaldo Natal, MD      . morphine 2 MG/ML injection           . ondansetron (ZOFRAN) tablet 4 mg  4 mg Oral Q6H PRN Arnaldo Natal, MD       Or  . ondansetron [facility]) injection 4 mg  4 mg Intravenous Q6H PRN Arnaldo Natal, MD      . traMADol Janean Sark) tablet 50 mg  50 mg Oral Q6H PRN Ramonita Lab, MD         Discharge Medications: Please see discharge summary for  a list of discharge medications.  Relevant Imaging Results:  Relevant Lab Results:   Additional Information (SSN: [id number])  Rosey Eide, Darleen CrockerBailey M, LCSW

## 2018-04-10 NOTE — H&P (Signed)
Kristin Graham is an 84 y.o. female.   Chief Complaint: Fall HPI: The patient with past medical history of stroke with residual left-sided weakness, decreased vision in the left eye and osteoporosis presents to the emergency department after a fall.  The patient reports that she is tripped and immediately felt pain in her left leg.  X-ray of the extremity showed distal left femur fracture.  The patient was in excruciating pain and received IV morphine in the emergency department prior to the treatment team calling the hospitalist service for admission.  Past Medical History:  Diagnosis Date  . Bilateral carpal tunnel syndrome    S/P surgery  . Depression    unspecified  . Foot fracture, right   . Glaucoma   . Left leg weakness    Progressive, evaluated by Ortho. LS spine MRI with enhancing mass involving L2 vertebra. oncology evaluation with Dr. Oliva Bustard 6/11; bone scan negative; PET scan negative. 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 Neurosurgery consultation arranged by Oncology.  . Left shoulder tendinitis    with partial rotator cuff tear evaluated by Dr. Leanor Kail  . Leg edema    likely venous insufficiency. Declines compression hose. Declines physical therapy. Echo 3/10 with normal heart function, mild MR.  . Osteoarthritis   . Osteoporosis, post-menopausal   . Stroke ([place])    History of CVA versus TIA, 1996, with reported episodes of TIA since then. Left pontine CVA by MRI 12/14.  [name] Urinary incontinence   . Vision loss of left eye    followed by Dr. Tobe Sos. Possibly glaucoma-induced versus ischemic neuropathy from vascular disease.    Past Surgical History:  Procedure Laterality Date  . CARPAL TUNNEL RELEASE  2003  . CATARACT EXTRACTION, BILATERAL  2000  . CHOLECYSTECTOMY    . JOINT REPLACEMENT  07/2005   left total knee, Dr. Leanor Kail    Family History  Problem Relation Age of Onset  . Osteoporosis Mother   .  Rheum arthritis Mother   . Stroke Mother   . Cancer Father        stomach and rectal  . Osteoporosis Sister   . Osteoporosis Sister    Social History:  reports that she has never smoked. She has never used smokeless tobacco. She reports that she does not drink alcohol or use drugs.  Allergies:  Allergies  Allergen Reactions  . Bee Venom Swelling  . Chocolate   . Nsaids   . Vicodin [Hydrocodone-Acetaminophen]     Medications Prior to Admission  Medication Sig Dispense Refill  . acetaminophen (TYLENOL) 325 MG tablet Take 650 mg by mouth every 4 (four) hours as needed.    . furosemide (LASIX) 20 MG tablet Take 30 mg by mouth daily.     . traMADol (ULTRAM) 50 MG tablet Take 50 mg by mouth every 6 (six) hours as needed.    [name] acetaminophen (TYLENOL) 500 MG tablet Take 250 mg by mouth at bedtime. 1/2 tablet per home routine    . ascorbic acid (VITAMIN C) 1000 MG tablet Take 1,000 mg by mouth 2 (two) times daily.    [name] aspirin 325 MG tablet Take 325 mg by mouth daily.     [name] atorvastatin (LIPITOR) 10 MG tablet Take 10 mg by mouth daily.     . B Complex Vitamins (VITAMIN-B COMPLEX) TABS Take 1 tablet by mouth daily.    . bimatoprost (LUMIGAN) 0.03 % ophthalmic solution Place 1 drop into both  eyes at bedtime.     . calcium carbonate (OS-CAL - DOSED IN MG OF ELEMENTAL CALCIUM) 1250 (500 Ca) MG tablet Take 1 tablet by mouth 2 (two) times daily.    . camphor-menthol (SARNA) lotion Apply 1 application topically daily as needed for itching. Apply to affected area    . Cranberry-Vitamin C-Vitamin E (CRANBERRY PLUS VITAMIN C) 4200-20-3 MG-MG-UNIT CAPS Take 1 capsule by mouth daily.    [name] docusate sodium (COLACE) 100 MG capsule Take 100 mg by mouth daily.    . dorzolamide-timolol (COSOPT) 22.3-6.8 MG/ML ophthalmic solution Place 1 drop into both eyes 2 (two) times daily.     . Ginkgo Biloba 60 MG CAPS Take 1 capsule by mouth daily.    . magnesium oxide (MAG-OX) 400 MG tablet Take 200 mg by mouth daily.      . Multiple Vitamins-Minerals (CENTRUM SILVER) tablet Take 1 tablet by mouth daily.    . OXYGEN Inhale 2 L into the lungs continuous.    . Probiotic Product (RISA-BID PROBIOTIC) TABS Take 1 tablet by mouth daily.    . traMADol (ULTRAM) 50 MG tablet Take 25 mg by mouth at bedtime. 1/2 tablet    . vitamin B-12 (CYANOCOBALAMIN) 100 MCG tablet Take 100 mcg by mouth daily.    . Vitamin E 400 units TABS Take 1 tablet by mouth daily.    . Zinc Sulfate (ZINC-220 PO) Take 1 tablet by mouth daily.      Results for orders placed or performed during the hospital encounter of 04/09/18 (from the past 48 hour(s))  Gastrointestinal Panel by PCR , Stool     Status: None   Collection Time: 04/09/18 11:35 PM  Result Value Ref Range   Campylobacter species NOT DETECTED NOT DETECTED   Plesimonas shigelloides NOT DETECTED NOT DETECTED   Salmonella species NOT DETECTED NOT DETECTED   Yersinia enterocolitica NOT DETECTED NOT DETECTED   Vibrio species NOT DETECTED NOT DETECTED   Vibrio cholerae NOT DETECTED NOT DETECTED   Enteroaggregative E coli (EAEC) NOT DETECTED NOT DETECTED   Enteropathogenic E coli (EPEC) NOT DETECTED NOT DETECTED   Enterotoxigenic E coli (ETEC) NOT DETECTED NOT DETECTED   Shiga like toxin producing E coli (STEC) NOT DETECTED NOT DETECTED   Shigella/Enteroinvasive E coli (EIEC) NOT DETECTED NOT DETECTED   Cryptosporidium NOT DETECTED NOT DETECTED   Cyclospora cayetanensis NOT DETECTED NOT DETECTED   Entamoeba histolytica NOT DETECTED NOT DETECTED   Giardia lamblia NOT DETECTED NOT DETECTED   Adenovirus F40/41 NOT DETECTED NOT DETECTED   Astrovirus NOT DETECTED NOT DETECTED   Norovirus GI/GII NOT DETECTED NOT DETECTED   Rotavirus A NOT DETECTED NOT DETECTED   Sapovirus (I, II, IV, and V) NOT DETECTED NOT DETECTED    Comment: Performed at [facility], [address] [place], [place] [address]  CBC     Status: Abnormal   Collection Time: 04/10/18 12:46 AM  Result Value  Ref Range   WBC 14.0 (H) 3.6 - 11.0 K/uL   RBC 5.01 3.80 - 5.20 MIL/uL   Hemoglobin 14.9 12.0 - 16.0 g/dL   HCT 45.9 35.0 - 47.0 %   MCV 91.6 80.0 - 100.0 fL   MCH 29.8 26.0 - 34.0 pg   MCHC 32.5 32.0 - 36.0 g/dL   RDW 12.7 11.5 - 14.5 %   Platelets 245 150 - 440 K/uL    Comment: Performed at [facility], [address] [place], [place] [address]  Comprehensive metabolic panel  Status: Abnormal   Collection Time: 04/10/18 12:46 AM  Result Value Ref Range   Sodium 142 135 - 145 mmol/L   Potassium 3.6 3.5 - 5.1 mmol/L   Chloride 102 98 - 111 mmol/L    Comment: Please note change in reference range.   CO2 32 22 - 32 mmol/L   Glucose, Bld 140 (H) 70 - 99 mg/dL    Comment: Please note change in reference range.   BUN 11 8 - 23 mg/dL    Comment: Please note change in reference range.   Creatinine, Ser 0.31 (L) 0.44 - 1.00 mg/dL   Calcium 8.8 (L) 8.9 - 10.3 mg/dL   Total Protein 6.3 (L) 6.5 - 8.1 g/dL   Albumin 3.1 (L) 3.5 - 5.0 g/dL   AST 21 15 - 41 U/L   ALT 15 0 - 44 U/L    Comment: Please note change in reference range.   Alkaline Phosphatase 55 38 - 126 U/L   Total Bilirubin 1.0 0.3 - 1.2 mg/dL   GFR calc non Af Amer >60 >60 mL/min   GFR calc Af Amer >60 >60 mL/min    Comment: (NOTE) The eGFR has been calculated using the CKD EPI equation. This calculation has not been validated in all clinical situations. eGFR's persistently <60 mL/min signify possible Chronic Kidney Disease.    Anion gap 8 5 - 15    Comment: Performed at [facility], [address] [place], [place] [address]  TSH     Status: None   Collection Time: 04/10/18 12:46 AM  Result Value Ref Range   TSH 0.430 0.350 - 4.500 uIU/mL    Comment: Performed by a 3rd Generation assay with a functional sensitivity of <=0.01 uIU/mL. Performed at [facility], [place], [place] [address]    Ct Femur Left Wo Contrast  Result Date: 04/10/2018 CLINICAL DATA:   84 year old female with left femur fracture. EXAM: CT OF THE LOWER LEFT EXTREMITY WITHOUT CONTRAST TECHNIQUE: Multidetector CT imaging of the lower left extremity was performed according to the standard protocol. COMPARISON:  Radiograph of the left knee dated 04/09/2018 FINDINGS: Bones/Joint/Cartilage There is a displaced oblique fracture of the distal femur with approximately 1 cm distraction gap and mild dorsal angulation and displacement of the distal fracture fragment. The fracture extends to the superior aspect of the femoral component of the arthroplasty. Evaluation of the distal femur and arthroplasty is limited due to advanced osteopenia and associated streak artifact from the metallic orthopedic hardware. There is no dislocation. No large joint effusion. Ligaments Suboptimally assessed by CT. Muscles and Tendons Small amount of hematoma noted in the distal quadriceps. Soft tissues No acute findings.  Vascular calcification. IMPRESSION: Mildly displaced and posteriorly angulated fracture of the distal femur with extension to the femoral component of the arthroplasty. There is advanced osteopenia and streak artifact caused by metallic hardware which limit evaluation of the fracture and orthopedic hardware. Electronically Signed   By: Anner Crete M.D.   On: 04/10/2018 02:32   Dg Knee Complete 4 Views Left  Result Date: 04/09/2018 CLINICAL DATA:  84 year old female with fall and left knee pain. EXAM: LEFT KNEE - COMPLETE 4+ VIEW COMPARISON:  Left knee radiograph dated 08/09/2005 FINDINGS: There is a mildly displaced spiral fracture of the distal femoral diaphysis with mild dorsal angulation of the distal fracture fragment. The fracture extends to the superior aspect of the femoral component of the arthroplasty as well as probable extension of another fracture  fragment into the midportion of the arthroplasty. There is no dislocation. There is advanced osteopenia. No significant joint effusion.  IMPRESSION: 1. Mildly displaced and angulated spiral fracture of the distal femur with extension to the femoral component of the arthroplasty. No dislocation. 2. Advanced osteopenia. Electronically Signed   By: Anner Crete M.D.   On: 04/09/2018 23:27    Review of Systems  Constitutional: Negative for chills and fever.  HENT: Negative for sore throat and tinnitus.   Eyes: Negative for blurred vision and redness.  Respiratory: Negative for cough and shortness of breath.   Cardiovascular: Negative for chest pain, palpitations, orthopnea and PND.  Gastrointestinal: Negative for abdominal pain, diarrhea, nausea and vomiting.  Genitourinary: Negative for dysuria, frequency and urgency.  Musculoskeletal: Positive for falls. Negative for joint pain and myalgias.  Skin: Negative for rash.       No lesions  Neurological: Negative for speech change, focal weakness and weakness.  Endo/Heme/Allergies: Does not bruise/bleed easily.       No temperature intolerance  Psychiatric/Behavioral: Negative for depression and suicidal ideas.    Blood pressure 107/85, pulse 94, temperature 97.7 F (36.5 C), temperature source Oral, resp. rate 17, height _0  (1.626 m), weight 74.8 kg (165 lb), SpO2 94 %. Physical Exam  Vitals reviewed. Constitutional: She is oriented to person, place, and time. She appears well-developed and well-nourished. No distress.  HENT:  Head: Normocephalic and atraumatic.  Mouth/Throat: Oropharynx is clear and moist.  Eyes: Pupils are equal, round, and reactive to light. Conjunctivae and EOM are normal. No scleral icterus.  Neck: Normal range of motion. Neck supple. No JVD present. No tracheal deviation present. No thyromegaly present.  Cardiovascular: Normal rate, regular rhythm and normal heart sounds. Exam reveals no gallop and no friction rub.  No murmur heard. Respiratory: Effort normal and breath sounds normal.  GI: Soft. Bowel sounds are normal. She exhibits no  distension. There is no tenderness.  Genitourinary:  Genitourinary Comments: Deferred  Musculoskeletal: Normal range of motion. She exhibits no edema.  Lymphadenopathy:    She has no cervical adenopathy.  Neurological: She is alert and oriented to person, place, and time. No cranial nerve deficit. She exhibits normal muscle tone.  Skin: Skin is warm and dry. No rash noted. No erythema.  Psychiatric: She has a normal mood and affect. Her behavior is normal. Judgment and thought content normal.     Assessment/Plan This is a 84 year old female admitted for femur fracture.   1.  Femur fracture left; manage severe pain with IV morphine as needed.  Orthopedic surgery consulted for repair.  Exline 2.  Osteoporosis: Continue calcium supplementation 3.  Glaucoma: Continue Cosopt eyedrops 4.  DVT prophylaxis: SCDs 5.  GI prophylaxis: None The patient is a full code.  Time spent on admission orders and patient care proximally 45 minutes  Harrie Foreman, MD 04/10/2018, 5:06 AM

## 2018-04-10 NOTE — Care Management Note (Signed)
Case Management Note  Patient Details  Name: MAIKA MCELVEEN MRN: [id number] Date of Birth: 29-Oct-1915  Subjective/Objective:                   RNCM met with patient to discuss transition of care. She states her daughter Marlowe Kays and [name]'s boyfriend Josph Macho live with her. She is not on supplemental O2 at home. He states she has history of home health and SNF but cannot remember name of either. Her PCP is Dr. Ramonita Lab. Action/Plan:   Home health list provided along with private duty agencies.  Expected Discharge Date:                  Expected Discharge Plan:     In-House Referral:     Discharge planning Services  CM Consult  Post Acute Care Choice:  Home Health Choice offered to:  Patient  DME Arranged:    DME Agency:     HH Arranged:    [place] Agency:     Status of Service:  In process, will continue to follow  If discussed at Long Length of Stay Meetings, dates discussed:    Additional Comments:  Marshell Garfinkel, RN 04/10/2018, 9:25 AM

## 2018-04-11 ENCOUNTER — Encounter
Admission: EM | Disposition: A | Discharge status: Hospice | Payer: Self-pay | Source: Home / Self Care | Attending: Internal Medicine

## 2018-04-11 DIAGNOSIS — Z66 Do not resuscitate: Secondary | ICD-10-CM

## 2018-04-11 DIAGNOSIS — Z515 Encounter for palliative care: Secondary | ICD-10-CM

## 2018-04-11 DIAGNOSIS — Z7189 Other specified counseling: Secondary | ICD-10-CM

## 2018-04-11 DIAGNOSIS — S72492A Other fracture of lower end of left femur, initial encounter for closed fracture: Secondary | ICD-10-CM

## 2018-04-11 SURGERY — OPEN REDUCTION INTERNAL FIXATION (ORIF) DISTAL FEMUR FRACTURE
Anesthesia: Choice | Laterality: Left

## 2018-04-11 MED ORDER — ENOXAPARIN SODIUM 40 MG/0.4ML ~~LOC~~ SOLN
40.0000 mg | SUBCUTANEOUS | Status: DC
  Administered 2018-04-11: 40 mg via SUBCUTANEOUS
  Filled 2018-04-11: qty 0.4

## 2018-04-11 MED ORDER — DORZOLAMIDE HCL-TIMOLOL MAL 2-0.5 % OP SOLN
1.0000 [drp] | Freq: Two times a day (BID) | OPHTHALMIC | Status: DC
  Administered 2018-04-11 – 2018-04-12 (×2): 1 [drp] via OPHTHALMIC
  Filled 2018-04-11 (×2): qty 10

## 2018-04-11 MED ORDER — LATANOPROST 0.005 % OP SOLN
1.0000 [drp] | Freq: Every day | OPHTHALMIC | Status: DC
  Administered 2018-04-11: 1 [drp] via OPHTHALMIC
  Filled 2018-04-11: qty 2.5

## 2018-04-11 NOTE — Progress Notes (Signed)
Clinical Social Worker (CSW) contacted patient's daughter Kristin Graham to discuss D/C plan. CSW explained hospice and palliative care options. Per Kristin Dresseronnie she would like [facility]. Kristin Graham has also spoke with the palliative medicine team and is in agreement with hospice. She is deciding between taking patient home with hospice or [facility] to [facility]. [name] lia[facility] is aware of above. RN case manager is aware of above.   [employer]Bailey Flois [place], LCSW [phone]

## 2018-04-11 NOTE — Progress Notes (Signed)
Per palliative patient's daughter has decided to take her home with [facility]. RN case manager aware of above. [facility] lia[facility] is aware of above. Please reconsult if future social work needs arise. CSW signing off.   [employer]Bailey Elaya Droege, LCSW [phone]

## 2018-04-11 NOTE — Consult Note (Signed)
ORTHOPAEDICS: Chart, records, and radiographs reviewed. The patient has a left distal femoral periprosthetic fracture. I meet with the patient and her daughter. [place].  The patient has been in declining health since a fall in September 2018. She was in SND for a short period of time but demonstrated no progress despite attempts at physical therapy. She has been unable to stand and has essentially been bed to chair transfers via a Hoyer lift at home. Her daughter has been the primary care provider for many years.  I discussed the potential risks and benefits of surgical intervention versus immobilization/nonsurgical treatment. Both the son and daughter requested non-surgical treatment. I explained the benefits of Hospice/palliative care.  Surgery was canceled and nursing was informed. I would recommend a longer knee immobilizer or a knee ROM brace locked in extension. Carefull attention will need to be directed at skin care.  Ronelle Smallman P. Angie FavaHooten, Jr. M.D.

## 2018-04-11 NOTE — Care Management (Signed)
Ordered bedrails and an air mattress [facility]

## 2018-04-11 NOTE — Progress Notes (Signed)
[facility] [place] [facility]   PATIENT NAME: Kenisha Lynds    MR#:  [id number]  DATE OF BIRTH:  24-Apr-1916  SUBJECTIVE:  C/o hip pain. Trying to eat breakfast  REVIEW OF SYSTEMS:   Review of Systems  Constitutional: Negative for chills, fever and weight loss.  HENT: Negative for ear discharge, ear pain and nosebleeds.   Eyes: Negative for blurred vision, pain and discharge.  Respiratory: Negative for sputum production, shortness of breath, wheezing and stridor.   Cardiovascular: Negative for chest pain, palpitations, orthopnea and PND.  Gastrointestinal: Negative for abdominal pain, diarrhea, nausea and vomiting.  Genitourinary: Negative for frequency and urgency.  Musculoskeletal: Positive for joint pain. Negative for back pain.  Neurological: Negative for sensory change, speech change, focal weakness and weakness.  Psychiatric/Behavioral: Negative for depression and hallucinations. The patient is not nervous/anxious.    Tolerating Diet:yes Tolerating PT: not rehab appropriate  DRUG ALLERGIES:   Allergies  Allergen Reactions  . Bee Venom Swelling  . Chocolate   . Nsaids   . Vicodin [Hydrocodone-Acetaminophen]     VITALS:  Blood pressure 107/64, pulse 97, temperature 98.5 F (36.9 C), temperature source Oral, resp. rate 18, height 5\' 4"  (1.626 m), weight 71.2 kg (157 lb), SpO2 99 %.  PHYSICAL EXAMINATION:   Physical Exam  GENERAL:  84 y.o.-year-old patient lying in the bed with no acute distress.  EYES: Pupils equal, round, reactive to light and accommodation. No scleral icterus. Extraocular muscles intact.  HEENT: Head atraumatic, normocephalic. Oropharynx and nasopharynx clear.  NECK:  Supple, no jugular venous distention. No thyroid enlargement, no tenderness.  LUNGS: Normal breath sounds bilaterally, no wheezing, rales, rhonchi. No use of accessory muscles of respiration.  CARDIOVASCULAR: S1, S2 normal. No murmurs, rubs, or gallops.   ABDOMEN: Soft, nontender, nondistended. Bowel sounds present. No organomegaly or mass.  EXTREMITIES: No cyanosis, clubbing or edema b/l.   Decreased ROm in left leg NEUROLOGIC: Cranial nerves II through XII are intact. No focal Motor deficit PSYCHIATRIC:  patient is alert and awake  SKIN: No obvious rash, lesion, or ulcer.   LABORATORY PANEL:  CBC Recent Labs  Lab 04/10/18 0046  WBC 14.0*  HGB 14.9  HCT 45.9  PLT 245    Chemistries  Recent Labs  Lab 04/10/18 0046  NA 142  K 3.6  CL 102  CO2 32  GLUCOSE 140*  BUN 11  CREATININE 0.31*  CALCIUM 8.8*  AST 21  ALT 15  ALKPHOS 55  BILITOT 1.0   Cardiac Enzymes No results for input(s): TROPONINI in the last 168 hours. RADIOLOGY:  Ct Femur Left Wo Contrast  Result Date: 04/10/2018 CLINICAL DATA:  84 year old female with left femur fracture. EXAM: CT OF THE LOWER LEFT EXTREMITY WITHOUT CONTRAST TECHNIQUE: Multidetector CT imaging of the lower left extremity was performed according to the standard protocol. COMPARISON:  Radiograph of the left knee dated 04/09/2018 FINDINGS: Bones/Joint/Cartilage There is a displaced oblique fracture of the distal femur with approximately 1 cm distraction gap and mild dorsal angulation and displacement of the distal fracture fragment. The fracture extends to the superior aspect of the femoral component of the arthroplasty. Evaluation of the distal femur and arthroplasty is limited due to advanced osteopenia and associated streak artifact from the metallic orthopedic hardware. There is no dislocation. No large joint effusion. Ligaments Suboptimally assessed by CT. Muscles and Tendons Small amount of hematoma noted in the distal quadriceps. Soft tissues No acute findings.  Vascular calcification. IMPRESSION: Mildly  displaced and posteriorly angulated fracture of the distal femur with extension to the femoral component of the arthroplasty. There is advanced osteopenia and streak artifact caused by metallic  hardware which limit evaluation of the fracture and orthopedic hardware. Electronically Signed   By: Elgie CollardArash  Radparvar M.D.   On: 04/10/2018 02:32   Dg Knee Complete 4 Views Left  Result Date: 04/09/2018 CLINICAL DATA:  84 year old female with fall and left knee pain. EXAM: LEFT KNEE - COMPLETE 4+ VIEW COMPARISON:  Left knee radiograph dated 08/09/2005 FINDINGS: There is a mildly displaced spiral fracture of the distal femoral diaphysis with mild dorsal angulation of the distal fracture fragment. The fracture extends to the superior aspect of the femoral component of the arthroplasty as well as probable extension of another fracture fragment into the midportion of the arthroplasty. There is no dislocation. There is advanced osteopenia. No significant joint effusion. IMPRESSION: 1. Mildly displaced and angulated spiral fracture of the distal femur with extension to the femoral component of the arthroplasty. No dislocation. 2. Advanced osteopenia. Electronically Signed   By: Elgie CollardArash  Radparvar M.D.   On: 04/09/2018 23:27   ASSESSMENT AND PLAN:  Ruthann Cancerlease J Pickney is a 84 y.o. female with below list of chronic medical conditions presents to the emergency department with 10 out of 10 left knee pain which occurred after falling while getting out of bed  1.  Femur fracture left; manage severe pain with IV morphine as needed.  -prn pain po meds - Orthopedic surgery consulted by dr hooten---nonsurgical option is choosen by dter and son -d/w dter at length--she is agreeable for palliative care to see pt and figure out d/c options -dter understands pt is not rehab appropriate at present given her prefall status.  2.  Osteoporosis: Continue calcium supplementation  3.  Glaucoma: Continue Cosopt eyedrops  4.  DVT prophylaxis: lovenox   5.  GI prophylaxis: None    Case discussed with Care Management/Social Worker. Management plans discussed with the patient, family and they are in agreement.  CODE STATUS:  DNR  DVT Prophylaxis: lovenox  TOTAL TIME TAKING CARE OF THIS PATIENT: *35 minutes.  >50% time spent on counselling and coordination of care  POSSIBLE D/C when d/c plan laid out DEPENDING ON CLINICAL CONDITION.  Note: This dictation was prepared with Dragon dictation along with smaller phrase technology. Any transcriptional errors that result from this process are unintentional.  Enedina FinnerSona Milledge Gerding M.D on 04/11/2018 at 9:55 AM  Between 7am to 6pm - Pager - [phone]  After 6pm go to [email or web address] - password [email or web address]  [facility]  Office  [phone]  CC: Primary care physician; [name], [name], MDPatient ID: Ruthann CancerAlease J Scarfo, female   DOB: 09-26-1916, 84 y.o.   MRN: [id number]

## 2018-04-11 NOTE — Consult Note (Signed)
Consultation Note Date: 04/11/2018   Patient Name: Kristin Graham  DOB: April 18, 1916  MRN: [id number]  Age / Sex: 84 y.o., female  PCP: Lynnea Ferrier, MD Referring Physician: Enedina Finner, MD  Reason for Consultation: Establishing goals of care  HPI/Patient Profile: 84 y.o. female admitted on 04/09/2018 from home after falling. She has a past medical history of glaucoma (left eye blindness), weakness, osteoarthritis, osteoporosis, post-menopausal, stroke (left-sided weakness), and urinary incontinence. During ED course patient reported that she tripped and immediately felt pain in her left leg. X-ray of the extremity showed distal left femur fracture. She received IV morphine in the emergency for complaints of excruciating pain. Since hospitalization daughter has decided not to proceed with surgical intervention. Palliative Medicine consulted for goals of care discussion.    Clinical Assessment and Goals of Care: I have reviewed medical records including lab results, imaging, Epic notes, and MAR, received report from the bedside RN, and assessed the patient. I discussed with daughter Junious Dresser [facility])  to discuss diagnosis prognosis, GOC, EOL wishes, disposition and options. Patient is awake and alert. Reports joint discomfort.   I introduced Palliative Medicine as specialized medical care for people living with serious illness. It focuses on providing relief from the symptoms and stress of a serious illness. The goal is to improve quality of life for both the patient and the family.  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 Daughter states she is a very independent woman. She is of [place] faith. Loves spending time with the family, reading, puzzles, and being outside.   As far as  functional and nutritional status daughter states she has been caring for her mother for the past 15 years. She reports over the past 6 months she has noticed a decline in her health. She has stopped eating as much, not walking and bedbound, became incontinent, and more and more sleeping during the day. She has lost about 20 lbs during this time. Daughter states she uses a hoyer lift to get her in and out of the bed. She has healing decub on her sacrum due to bedbound which daughter states she is using mattress overlay in the home for support of skin. She states her mother has not walked in over 6 months and yesterday she decided to attempt to get up resulting in the fall and fracture.   We discussed her current illness and what it means in the larger context of her on-going co-morbidities.  Natural disease trajectory and expectations at EOL were discussed. Daughter verbalized recognizing the signs of decline over the months.   I attempted to elicit values and goals of care important to the patient.    The difference between aggressive medical intervention and comfort care was considered in light of the patient's goals of care. At this time daughter would not like to proceed with any forms of aggressive  measures. She would like her to remain comfortable with hopes of getting her home soon.   Advanced directives, concepts specific to code status, artifical feeding and hydration, and rehospitalization were considered and discussed. She is a DNR/DNI as confirmed by the daughter.   Hospice and Palliative Care services outpatient were explained and offered. Daughter reports she did not want her mother to pass in a facility and felt she is able to care for her in the home with hospice support. She is also planning to hire a home agency  ([facility]) to come into the home and assist.   Questions and concerns were addressed.  Hard Choices booklet left for review. The family was encouraged to call with  questions or concerns.  PMT will continue to support holistically.  Primary Decision Maker: Particia Latheronnie Antuna (daughter)  HCPOA    SUMMARY OF RECOMMENDATIONS    DNR/DNI per daughter  Continue to treat the treatable, with hopes of returning to the home for comfort measures with hospice.  Case Management consult for outpatient hospice and equipment needs as specified by daughter (bed rails, hoyer lift, and air matt overlay).   Palliative Medicine will continue to support patient and family during hospitalization.   Code Status/Advance Care Planning:  DNR/DNI    Palliative Prophylaxis:   Aspiration, Bowel Regimen, Delirium Protocol, Eye Care, Frequent Pain Assessment, Oral Care and Turn Reposition  Additional Recommendations (Limitations, Scope, Preferences):  Full Scope Treatment-continue to treat the treatable without aggressive interventions.   Psycho-social/Spiritual:   Desire for further Chaplaincy support:NO   Prognosis:   < 6 months-guarded to poor in the setting of falls, fractured femur without surgical intervention, pain, dementia, poor po intake, immobility, weight loss of 20 lb over last 6 months.   Discharge Planning: Home with Hospice      Primary Diagnoses: Present on Admission: . Femur fracture, left (HCC)   I have reviewed the medical record, interviewed the patient and family, and examined the patient. The following aspects are pertinent.  Past Medical History:  Diagnosis Date  . Bilateral carpal tunnel syndrome    S/P surgery  . Depression    unspecified  . Foot fracture, right   . Glaucoma   . Left leg weakness    Progressive, evaluated by Ortho. LS spine MRI with enhancing mass involving L2 vertebra. oncology evaluation with Dr. Doylene Canninghoksi 6/11; bone scan negative; PET scan negative. 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 Neurosurgery consultation arranged by Oncology.  . Left shoulder tendinitis    with  partial rotator cuff tear evaluated by Dr. Erin SonsHarold Kernodle  . Leg edema    likely venous insufficiency. Declines compression hose. Declines physical therapy. Echo 3/10 with normal heart function, mild MR.  . Osteoarthritis   . Osteoporosis, post-menopausal   . Stroke (HCC)    History of CVA versus TIA, 1996, with reported episodes of TIA since then. Left pontine CVA by MRI 12/14.  [name]. Urinary incontinence   . Vision loss of left eye    followed by Dr. Fransico MichaelBrennan. Possibly glaucoma-induced versus ischemic neuropathy from vascular disease.   Social History   Socioeconomic History  . Marital status: Widowed    Spouse name: Not on file  . Number of children: 2  . Years of education: Not on file  . Highest education level: Not on file  Occupational History  . Not on file  Social Needs  . Financial resource strain: Not on file  . Food  insecurity:    Worry: Not on file    Inability: Not on file  . Transportation needs:    Medical: Not on file    Non-medical: Not on file  Tobacco Use  . Smoking status: Never Smoker  . Smokeless tobacco: Never Used  Substance and Sexual Activity  . Alcohol use: No  . Drug use: No  . Sexual activity: Not on file  Lifestyle  . Physical activity:    Days per week: Not on file    Minutes per session: Not on file  . Stress: Not on file  Relationships  . Social connections:    Talks on phone: Not on file    Gets together: Not on file    Attends religious service: Not on file    Active member of club or organization: Not on file    Attends meetings of clubs or organizations: Not on file    Relationship status: Not on file  Other Topics Concern  . Not on file  Social History Narrative   Full Code   Never smoker   Denies alcohol use   Widowed    2 children   Daughter is HPCOA   Family History  Problem Relation Age of Onset  . Osteoporosis Mother   . Rheum arthritis Mother   . Stroke Mother   . Cancer Father        stomach and rectal  .  Osteoporosis Sister   . Osteoporosis Sister    Scheduled Meds: . docusate sodium  100 mg Oral BID  . dorzolamide-timolol  1 drop Both Eyes BID  . enoxaparin (LOVENOX) injection  40 mg Subcutaneous Q24H  . furosemide  30 mg Oral Daily  . latanoprost  1 drop Both Eyes QHS   Continuous Infusions: PRN Meds:.acetaminophen **OR** acetaminophen, morphine injection, ondansetron **OR** ondansetron (ZOFRAN) IV, traMADol Medications Prior to Admission:  Prior to Admission medications   Medication Sig Start Date End Date Taking? Authorizing Provider  acetaminophen (TYLENOL) 325 MG tablet Take 650 mg by mouth every 4 (four) hours as needed.   Yes [provider]  furosemide (LASIX) 20 MG tablet Take 30 mg by mouth daily.    Yes [provider]  traMADol (ULTRAM) 50 MG tablet Take 50 mg by mouth every 6 (six) hours as needed.   Yes [provider]  acetaminophen (TYLENOL) 500 MG tablet Take 250 mg by mouth at bedtime. 1/2 tablet per home routine    [provider]  ascorbic acid (VITAMIN C) 1000 MG tablet Take 1,000 mg by mouth 2 (two) times daily.    [provider]  aspirin 325 MG tablet Take 325 mg by mouth daily.     [provider]  atorvastatin (LIPITOR) 10 MG tablet Take 10 mg by mouth daily.     [provider]  B Complex Vitamins (VITAMIN-B COMPLEX) TABS Take 1 tablet by mouth daily.    [provider]  bimatoprost (LUMIGAN) 0.03 % ophthalmic solution Place 1 drop into both eyes at bedtime.     [provider]  calcium carbonate (OS-CAL - DOSED IN MG OF ELEMENTAL CALCIUM) 1250 (500 Ca) MG tablet Take 1 tablet by mouth 2 (two) times daily.    [provider]  camphor-menthol Wynelle Fanny) lotion Apply 1 application topically daily as needed for itching. Apply to affected area    [provider]  Cranberry-Vitamin C-Vitamin E (CRANBERRY PLUS VITAMIN C) 4200-20-3 MG-MG-UNIT CAPS Take 1 capsule by mouth  daily.  [provider]  docusate sodium (COLACE) 100 MG capsule Take 100 mg by mouth daily.    [provider]  dorzolamide-timolol (COSOPT) 22.3-6.8 MG/ML ophthalmic solution Place 1 drop into both eyes 2 (two) times daily.     [provider]  Ginkgo Biloba 60 MG CAPS Take 1 capsule by mouth daily.    [provider]  magnesium oxide (MAG-OX) 400 MG tablet Take 200 mg by mouth daily.     [provider]  Multiple Vitamins-Minerals (CENTRUM SILVER) tablet Take 1 tablet by mouth daily.    [provider]  Probiotic Product (RISA-BID PROBIOTIC) TABS Take 1 tablet by mouth daily.    [provider]  traMADol (ULTRAM) 50 MG tablet Take 25 mg by mouth at bedtime. 1/2 tablet    [provider]  vitamin B-12 (CYANOCOBALAMIN) 100 MCG tablet Take 100 mcg by mouth daily.    [provider]  Vitamin E 400 units TABS Take 1 tablet by mouth daily.    [provider]  Zinc Sulfate (ZINC-220 PO) Take 1 tablet by mouth daily.    [provider]   Allergies  Allergen Reactions  . Bee Venom Swelling  . Chocolate   . Nsaids   . Vicodin [Hydrocodone-Acetaminophen]    Review of Systems  Unable to perform ROS: Dementia  Musculoskeletal:       Pain   Neurological: Positive for weakness.    Physical Exam  Constitutional: Vital signs are normal. She has a sickly appearance.  Thin and fragile in appearance   Cardiovascular: Normal rate, regular rhythm, normal heart sounds and normal pulses.  Pulmonary/Chest: Effort normal. She has decreased breath sounds.  Musculoskeletal:  Immobilizer for stability   Neurological: She is alert. She is disoriented.  Psychiatric: Judgment normal. Cognition and memory are normal.  Nursing note and vitals reviewed.   Vital Signs: BP 107/64 (BP Location: Right Arm)   Pulse 97   Temp 98.5 F (36.9 C) (Oral)   Resp 18   Ht 5\' 4"  (1.626 m)   Wt 71.2 kg (157 lb)   SpO2  99%   BMI 26.95 kg/m  Pain Scale: 0-10 POSS *See Group Information*: 1-Acceptable,Awake and alert Pain Score: 3    SpO2: SpO2: 99 % O2 Device:SpO2: 99 % O2 Flow Rate: .O2 Flow Rate (L/min): 2 L/min  IO: Intake/output summary:   Intake/Output Summary (Last 24 hours) at 04/11/2018 1312 Last data filed at 04/11/2018 0919 Gross per 24 hour  Intake 120 ml  Output 210 ml  Net -90 ml    LBM: Last BM Date: 04/10/18 Baseline Weight: Weight: 63.5 kg (140 lb) Most recent weight: Weight: 71.2 kg (157 lb)     Palliative Assessment/Data:   Time In: 1130 Time Out: 1305 Time Total: 95 min.  Greater than 50%  of this time was spent counseling and coordinating care related to the above assessment and plan.  Signed by: Willette Alma, NP-BC Palliative Medicine Team  Phone: [phone] Fax: [phone] Pager: [phone] Amion: Thea Alken    Please contact Palliative Medicine Team phone at [phone] for questions and concerns.  For individual provider: See Loretha Stapler

## 2018-04-12 NOTE — Progress Notes (Signed)
Pt ready for d/c home today with hospice per MD. Reviewed discharge instructions with daughter, Junious DresserConnie, via telephone and had lengthy conversation with her about aspiration precautions, positioning, etc. Pt will be transported home via EMS. Junious DresserConnie will call back when she has the house ready for pt, and EMS will be set up. Knee immobilizer change to 19 inch per Dr. Ernest PineHooten prior to d/c.   [place], Kristin Graham

## 2018-04-12 NOTE — Progress Notes (Signed)
Daily Progress Note   Patient Name: Kristin Graham       Date: 04/12/2018 DOB: January 04, 1916  Age: 84 y.o. MRN#: [id number] Attending Physician: No att. providers found Primary Care Physician: Lynnea Ferrier, MD Admit Date: 04/09/2018  Reason for Consultation/Follow-up: Establishing goals of care  Subjective: Patient lying in bed staring out window. Would not follow commands. Appears comfortable. When asked about pain patient did not respond. No family at bedside. Spoke with daughter who was at home preparing equipment needs etc.for patient to be discharged home. Daughter verbalize plan for patient to come home with hospice support. No further questions or concerns.   Length of Stay: 2  Current Medications: Scheduled Meds:  . docusate sodium  100 mg Oral BID  . dorzolamide-timolol  1 drop Both Eyes BID  . enoxaparin (LOVENOX) injection  40 mg Subcutaneous Q24H  . furosemide  30 mg Oral Daily  . latanoprost  1 drop Both Eyes QHS    Continuous Infusions:   PRN Meds: acetaminophen **OR** acetaminophen, morphine injection, ondansetron **OR** ondansetron (ZOFRAN) IV, traMADol  Physical Exam       Constitutional: Vital signs are normal. She has a sickly appearance.  Thin and fragile in appearance   Cardiovascular: Normal rate, regular rhythm, normal heart sounds and normal pulses.  Pulmonary/Chest: Effort normal. She has decreased breath sounds.  Musculoskeletal:  Immobilizer for stability   Neurological: She is alert. She is disoriented.  Psychiatric: Judgment normal. Cognition and memory are normal.  Nursing note and vitals reviewed.    Vital Signs: BP (!) 112/58 (BP Location: Left Arm)   Pulse 90   Temp 97.8 F (36.6 C) (Axillary)   Resp 20   Ht 5\' 4"  (1.626 m)   Wt 71.2 kg  (157 lb)   SpO2 93%   BMI 26.95 kg/m  SpO2: SpO2: 93 % O2 Device: O2 Device: Room Air O2 Flow Rate: O2 Flow Rate (L/min): 2 L/min  Intake/output summary:   Intake/Output Summary (Last 24 hours) at 04/12/2018 1536 Last data filed at 04/12/2018 1402 Gross per 24 hour  Intake 240 ml  Output 150 ml  Net 90 ml   LBM: Last BM Date: 04/10/18 Baseline Weight: Weight: 63.5 kg (140 lb) Most recent weight: Weight: 71.2 kg (157 lb)       Palliative  Assessment/Data: PPS 20%    Flowsheet Rows     Most Recent Value  Intake Tab  Referral Department  Hospitalist  Unit at Time of Referral  Med/Surg Unit  Palliative Care Primary Diagnosis  Trauma  Date Notified  04/11/18  Palliative Care Type  Return patient Palliative Care  Reason for referral  Clarify Goals of Care  Date of Admission  04/09/18  Date first seen by Palliative Care  04/11/18  # of days Palliative referral response time  0 Day(s)  # of days IP prior to Palliative referral  2  Clinical Assessment  Psychosocial & Spiritual Assessment  Palliative Care Outcomes      Patient Active Problem List   Diagnosis Date Noted  . Femur fracture, left (HCC) 04/10/2018  . Gastroenteritis 12/28/2017  . Pressure injury of skin 12/28/2017  . Protein-calorie malnutrition (HCC) 07/26/2017  . Hip pain 07/09/2017  . Pelvic fracture (HCC) 07/08/2017  . Cataract 07/08/2017  . CHF (congestive heart failure) (HCC) 07/08/2017  . Dyslipidemia 07/08/2017  . TIA (transient ischemic attack) 07/08/2017    Palliative Care Assessment & Plan   Patient Profile: 84 y.o. female admitted on 04/09/2018 from home after falling. She has a past medical history of glaucoma (left eye blindness), weakness, osteoarthritis, osteoporosis, post-menopausal, stroke (left-sided weakness), and urinary incontinence. During ED course patient reported that she tripped and immediately felt pain in her left leg. X-ray of the extremity showed distal left femur fracture. She  received IV morphine in the emergency for complaints of excruciating pain. Since hospitalization daughter has decided not to proceed with surgical intervention. Palliative Medicine consulted for goals of care discussion.    Recommendations/Plan:  DNR/DNI per daughter  Continue to treat the treatable, with hopes of returning to the home for comfort measures with hospice.  Case Management securing needs for outpatient hospice and equipment needs as specified by daughter (bed rails, hoyer lift, and air matt overlay).   Palliative Medicine will continue to support patient and family during hospitalization.    Goals of Care and Additional Recommendations:  Limitations on Scope of Treatment: Full Scope Treatment  Code Status:    Code Status Orders  (From admission, onward)        Start     Ordered   04/10/18 1250  Do not attempt resuscitation (DNR)  Continuous    Question Answer Comment  In the event of cardiac or respiratory ARREST Do not call a "code blue"   In the event of cardiac or respiratory ARREST Do not perform Intubation, CPR, defibrillation or ACLS   In the event of cardiac or respiratory ARREST Use medication by any route, position, wound care, and other measures to relive pain and suffering. May use oxygen, suction and manual treatment of airway obstruction as needed for comfort.   Comments rN may pronounce      04/10/18 1249    Code Status History    Date Active Date Inactive Code Status Order ID Comments User Context   04/10/2018 0309 04/10/2018 1249 Full Code [id number]  Arnaldo Nataliamond, Michael S, MD ED   12/28/2017 0442 12/31/2017 1521 Full Code [id number]  Arnaldo Nataliamond, Michael S, MD Inpatient   07/08/2017 0352 07/10/2017 2056 Full Code [id number]  Gery Prayrosley, Debby, MD ED    Advance Directive Documentation     Most Recent Value  Type of Advance Directive  Healthcare Power of Attorney, Out of facility DNR (pink MOST or yellow form)  Pre-existing out of facility DNR order (yellow form  or  pink MOST form)  Yellow form placed in chart (order not valid for inpatient use)  "MOST" Form in Place?  -       Prognosis:   < 6 months guarded to poor in the setting of falls, fractured femur without surgical intervention, pain, dementia, poor po intake, immobility, weight loss of 20 lb over last 6 months.   Discharge Planning:  Home with Hospice  Care plan was discussed with patient's daughter and bedside RN.   Thank you for allowing the Palliative Medicine Team to assist in the care of this patient.   Total Time 25 min Prolonged Time Billed  NO        Greater than 50%  of this time was spent counseling and coordinating care related to the above assessment and plan.  Willette Alma, NP  Please contact Palliative Medicine Team phone at [phone] for questions and concerns.

## 2018-04-12 NOTE — Care Management Note (Signed)
Case Management Note  Patient Details  Name: Ruthann Cancerlease J Xiang MRN: [id number] Date of Birth: 1915/11/17  Subjective/Objective:  Patient discharging home with hospice care through [facility]. Hoyer lift cancelled.                    Action/Plan: Notified Denise with [facility] of discharge.  Medical Necessity completed.   Expected Discharge Date:                  Expected Discharge Plan:  Home w Hospice Care  In-House Referral:     Discharge planning Services  CM Consult  Post Acute Care Choice:  Hospice, Durable Medical Equipment Choice offered to:  Patient, Adult Children  DME Arranged:  Air overlay mattress(bedrails, Nurse, adulthoyer lift) DME Agency:  [facility]  HH Arranged:  Disease Management HH Agency:  [facility]  Status of Service:  Completed, signed off  If discussed at Long Length of Stay Meetings, dates discussed:    Additional Comments:  Marily MemosLisa M Layson Bertsch, RN 04/12/2018, 9:01 AM

## 2018-04-12 NOTE — Discharge Summary (Signed)
[facility] - [place] at [facility]   PATIENT NAME: Kristin Graham    MR#:  [id number]  DATE OF BIRTH:  May 12, 1916  DATE OF ADMISSION:  04/09/2018 ADMITTING PHYSICIAN: Arnaldo Natal, MD  DATE OF DISCHARGE: 04/12/2018  PRIMARY CARE PHYSICIAN: Lynnea Ferrier, MD    ADMISSION DIAGNOSIS:  Diarrhea of presumed infectious origin [R19.7] Other closed fracture of distal end of left femur, initial encounter (HCC) [S72.492A]  DISCHARGE DIAGNOSIS:  Closed fracture of distal left femur s/p mechanical fall---Non surgical management Failure to thrive SECONDARY DIAGNOSIS:   Past Medical History:  Diagnosis Date  . Bilateral carpal tunnel syndrome    S/P surgery  . Depression    unspecified  . Foot fracture, right   . Glaucoma   . Left leg weakness    Progressive, evaluated by Ortho. LS spine MRI with enhancing mass involving L2 vertebra. oncology evaluation with Dr. Doylene Canning 6/11; bone scan negative; PET scan negative. 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 Neurosurgery consultation arranged by Oncology.  . Left shoulder tendinitis    with partial rotator cuff tear evaluated by Dr. Erin Sons  . Leg edema    likely venous insufficiency. Declines compression hose. Declines physical therapy. Echo 3/10 with normal heart function, mild MR.  . Osteoarthritis   . Osteoporosis, post-menopausal   . Stroke (HCC)    History of CVA versus TIA, 1996, with reported episodes of TIA since then. Left pontine CVA by MRI 12/14.  [name] Urinary incontinence   . Vision loss of left eye    followed by Dr. Fransico Michael. Possibly glaucoma-induced versus ischemic neuropathy from vascular disease.    HOSPITAL COURSE:   Rosaisela Jamroz Smithis a 84 y.o.femalewith below list of chronic medical conditions presents to the emergency department with 10 out of 10 left knee pain which occurred after falling while getting out of bed  1. Femur fracture  left; manage severe pain with IV morphine and oral tramadol as needed.  -prn pain po meds -Orthopedic surgery consulted by dr hooten---nonsurgical option is choosen by dter and son -d/w dter at length--she is agreeable for palliative care to see pt and figure out d/c options -dter understands pt is not rehab appropriate at present given her prefall status. -dter would like pt to go home with hospice services and hh also  2. Osteoporosis: Continue calcium supplementation  3. Glaucoma: Continue Cosopt eyedrops  4. DVT prophylaxis: lovenox   5.Failure to thrive hospice to follow at home. Pt has a poor long term prognosis.   CONSULTS OBTAINED:  Treatment Team:  Signa Kell, MD Donato Heinz, MD  DRUG ALLERGIES:   Allergies  Allergen Reactions  . Bee Venom Swelling  . Chocolate   . Nsaids   . Vicodin [Hydrocodone-Acetaminophen]     DISCHARGE MEDICATIONS:   Allergies as of 04/12/2018      Reactions   Bee Venom Swelling   Chocolate    Nsaids    Vicodin [hydrocodone-acetaminophen]       Medication List    TAKE these medications   acetaminophen 500 MG tablet Commonly known as:  TYLENOL Take 250 mg by mouth at bedtime. 1/2 tablet per home routine   acetaminophen 325 MG tablet Commonly known as:  TYLENOL Take 650 mg by mouth every 4 (four) hours as needed.   ascorbic acid 1000 MG tablet Commonly known as:  VITAMIN C Take 1,000 mg by mouth 2 (two) times daily.  aspirin 325 MG tablet Take 325 mg by mouth daily.   atorvastatin 10 MG tablet Commonly known as:  LIPITOR Take 10 mg by mouth daily.   bimatoprost 0.03 % ophthalmic solution Commonly known as:  LUMIGAN Place 1 drop into both eyes at bedtime.   calcium carbonate 1250 (500 Ca) MG tablet Commonly known as:  OS-CAL - dosed in mg of elemental calcium Take 1 tablet by mouth 2 (two) times daily.   camphor-menthol lotion Commonly known as:  SARNA Apply 1 application topically daily as needed for  itching. Apply to affected area   CENTRUM SILVER tablet Take 1 tablet by mouth daily.   CRANBERRY PLUS VITAMIN C 4200-20-3 MG-MG-UNIT Caps Generic drug:  Cranberry-Vitamin C-Vitamin E Take 1 capsule by mouth daily.   docusate sodium 100 MG capsule Commonly known as:  COLACE Take 100 mg by mouth daily.   dorzolamide-timolol 22.3-6.8 MG/ML ophthalmic solution Commonly known as:  COSOPT Place 1 drop into both eyes 2 (two) times daily.   furosemide 20 MG tablet Commonly known as:  LASIX Take 30 mg by mouth daily.   Ginkgo Biloba 60 MG Caps Take 1 capsule by mouth daily.   magnesium oxide 400 MG tablet Commonly known as:  MAG-OX Take 200 mg by mouth daily.   RISA-BID PROBIOTIC Tabs Take 1 tablet by mouth daily.   traMADol 50 MG tablet Commonly known as:  ULTRAM Take 25 mg by mouth at bedtime. 1/2 tablet   traMADol 50 MG tablet Commonly known as:  ULTRAM Take 50 mg by mouth every 6 (six) hours as needed.   vitamin B-12 100 MCG tablet Commonly known as:  CYANOCOBALAMIN Take 100 mcg by mouth daily.   Vitamin E 400 units Tabs Take 1 tablet by mouth daily.   Vitamin-B Complex Tabs Take 1 tablet by mouth daily.   ZINC-220 PO Take 1 tablet by mouth daily.       If you experience worsening of your admission symptoms, develop shortness of breath, life threatening emergency, suicidal or homicidal thoughts you must seek medical attention immediately by calling 911 or calling your MD immediately  if symptoms less severe.  You Must read complete instructions/literature along with all the possible adverse reactions/side effects for all the Medicines you take and that have been prescribed to you. Take any new Medicines after you have completely understood and accept all the possible adverse reactions/side effects.   Please note  You were cared for by a hospitalist during your hospital stay. If you have any questions about your discharge medications or the care you received  while you were in the hospital after you are discharged, you can call the unit and asked to speak with the hospitalist on call if the hospitalist that took care of you is not available. Once you are discharged, your primary care physician will handle any further medical issues. Please note that NO REFILLS for any discharge medications will be authorized once you are discharged, as it is imperative that you return to your primary care physician (or establish a relationship with a primary care physician if you do not have one) for your aftercare needs so that they can reassess your need for medications and monitor your lab values. Today   SUBJECTIVE   Poor appetite per Rn No other issues   VITAL SIGNS:  Blood pressure 112/67, pulse 88, temperature 98 F (36.7 C), temperature source Oral, resp. rate 16, height 5\' 4"  (1.626 m), weight 71.2 kg (157 lb), SpO2 92 %.  I/O:    Intake/Output Summary (Last 24 hours) at 04/12/2018 1108 Last data filed at 04/12/2018 0900 Gross per 24 hour  Intake 60 ml  Output -  Net 60 ml    PHYSICAL EXAMINATION:  GENERAL:  84 y.o.-year-old patient lying in the bed with no acute distress.  EYES: Pupils equal, round, reactive to light and accommodation. No scleral icterus. Extraocular muscles intact.  HEENT: Head atraumatic, normocephalic. Oropharynx and nasopharynx clear.  NECK:  Supple, no jugular venous distention. No thyroid enlargement, no tenderness.  LUNGS: Normal breath sounds bilaterally, no wheezing, rales,rhonchi or crepitation. No use of accessory muscles of respiration.  CARDIOVASCULAR: S1, S2 normal. No murmurs, rubs, or gallops.  ABDOMEN: Soft, non-tender, non-distended. Bowel sounds present. No organomegaly or mass.  EXTREMITIES: No pedal edema, cyanosis, or clubbing.  NEUROLOGIC: Cranial nerves II through XII are intact.moves extremties well except left LE  Sensation intact. Gait not checked.  PSYCHIATRIC: The patient is alert SKIN: No obvious rash,  lesion, or ulcer.   DATA REVIEW:   CBC  Recent Labs  Lab 04/10/18 0046  WBC 14.0*  HGB 14.9  HCT 45.9  PLT 245    Chemistries  Recent Labs  Lab 04/10/18 0046  NA 142  K 3.6  CL 102  CO2 32  GLUCOSE 140*  BUN 11  CREATININE 0.31*  CALCIUM 8.8*  AST 21  ALT 15  ALKPHOS 55  BILITOT 1.0    Microbiology Results   Recent Results (from the past 240 hour(s))  Gastrointestinal Panel by PCR , Stool     Status: None   Collection Time: 04/09/18 11:35 PM  Result Value Ref Range Status   Campylobacter species NOT DETECTED NOT DETECTED Final   Plesimonas shigelloides NOT DETECTED NOT DETECTED Final   Salmonella species NOT DETECTED NOT DETECTED Final   Yersinia enterocolitica NOT DETECTED NOT DETECTED Final   Vibrio species NOT DETECTED NOT DETECTED Final   Vibrio cholerae NOT DETECTED NOT DETECTED Final   Enteroaggregative E coli (EAEC) NOT DETECTED NOT DETECTED Final   Enteropathogenic E coli (EPEC) NOT DETECTED NOT DETECTED Final   Enterotoxigenic E coli (ETEC) NOT DETECTED NOT DETECTED Final   Shiga like toxin producing E coli (STEC) NOT DETECTED NOT DETECTED Final   Shigella/Enteroinvasive E coli (EIEC) NOT DETECTED NOT DETECTED Final   Cryptosporidium NOT DETECTED NOT DETECTED Final   Cyclospora cayetanensis NOT DETECTED NOT DETECTED Final   Entamoeba histolytica NOT DETECTED NOT DETECTED Final   Giardia lamblia NOT DETECTED NOT DETECTED Final   Adenovirus F40/41 NOT DETECTED NOT DETECTED Final   Astrovirus NOT DETECTED NOT DETECTED Final   Norovirus GI/GII NOT DETECTED NOT DETECTED Final   Rotavirus A NOT DETECTED NOT DETECTED Final   Sapovirus (I, II, IV, and V) NOT DETECTED NOT DETECTED Final    Comment: Performed at [facility], [address] [place], [place] [address]    RADIOLOGY:  No results found.   Management plans discussed with the patient, family and they are in agreement.  CODE STATUS:     Code Status Orders  (From admission,  onward)        Start     Ordered   04/10/18 1250  Do not attempt resuscitation (DNR)  Continuous    Question Answer Comment  In the event of cardiac or respiratory ARREST Do not call a "code blue"   In the event of cardiac or respiratory ARREST Do not perform Intubation, CPR, defibrillation or ACLS   In  the event of cardiac or respiratory ARREST Use medication by any route, position, wound care, and other measures to relive pain and suffering. May use oxygen, suction and manual treatment of airway obstruction as needed for comfort.   Comments rN may pronounce      04/10/18 1249    Code Status History    Date Active Date Inactive Code Status Order ID Comments User Context   04/10/2018 0309 04/10/2018 1249 Full Code [id number]  Arnaldo Nataliamond, Michael S, MD ED   12/28/2017 0442 12/31/2017 1521 Full Code [id number]  Arnaldo Nataliamond, Michael S, MD Inpatient   07/08/2017 0352 07/10/2017 2056 Full Code [id number]  Gery Prayrosley, Debby, MD ED    Advance Directive Documentation     Most Recent Value  Type of Advance Directive  Healthcare Power of Attorney, Out of facility DNR (pink MOST or yellow form)  Pre-existing out of facility DNR order (yellow form or pink MOST form)  Yellow form placed in chart (order not valid for inpatient use)  "MOST" Form in Place?  -      TOTAL TIME TAKING CARE OF THIS PATIENT: *40* minutes.    Enedina FinnerSona Corayma Cashatt M.D on 04/12/2018 at 11:08 AM  Between 7am to 6pm - Pager - [phone] After 6pm go to [email or web address] - password [email or web address]  [facility]  Office  [phone]  CC: Primary care physician; [name], [name], MD

## 2018-04-13 ENCOUNTER — Emergency Department: Payer: Medicare Other

## 2018-04-13 ENCOUNTER — Observation Stay
Admission: EM | Admit: 2018-04-13 | Discharge: 2018-04-15 | Disposition: A | Discharge status: Home / Self Care | Payer: Medicare Other | Attending: Internal Medicine | Admitting: Internal Medicine

## 2018-04-13 DIAGNOSIS — R748 Abnormal levels of other serum enzymes: Secondary | ICD-10-CM | POA: Diagnosis not present

## 2018-04-13 DIAGNOSIS — I2782 Chronic pulmonary embolism: Principal | ICD-10-CM | POA: Insufficient documentation

## 2018-04-13 DIAGNOSIS — R911 Solitary pulmonary nodule: Secondary | ICD-10-CM | POA: Insufficient documentation

## 2018-04-13 DIAGNOSIS — R0600 Dyspnea, unspecified: Secondary | ICD-10-CM | POA: Diagnosis present

## 2018-04-13 DIAGNOSIS — Z7982 Long term (current) use of aspirin: Secondary | ICD-10-CM | POA: Insufficient documentation

## 2018-04-13 DIAGNOSIS — I272 Pulmonary hypertension, unspecified: Secondary | ICD-10-CM | POA: Diagnosis not present

## 2018-04-13 DIAGNOSIS — H409 Unspecified glaucoma: Secondary | ICD-10-CM | POA: Diagnosis not present

## 2018-04-13 DIAGNOSIS — Z8673 Personal history of transient ischemic attack (TIA), and cerebral infarction without residual deficits: Secondary | ICD-10-CM | POA: Insufficient documentation

## 2018-04-13 DIAGNOSIS — R609 Edema, unspecified: Secondary | ICD-10-CM | POA: Diagnosis not present

## 2018-04-13 DIAGNOSIS — K529 Noninfective gastroenteritis and colitis, unspecified: Secondary | ICD-10-CM | POA: Diagnosis not present

## 2018-04-13 DIAGNOSIS — Z79899 Other long term (current) drug therapy: Secondary | ICD-10-CM | POA: Diagnosis not present

## 2018-04-13 DIAGNOSIS — X58XXXA Exposure to other specified factors, initial encounter: Secondary | ICD-10-CM | POA: Insufficient documentation

## 2018-04-13 DIAGNOSIS — S72002A Fracture of unspecified part of neck of left femur, initial encounter for closed fracture: Secondary | ICD-10-CM | POA: Diagnosis not present

## 2018-04-13 DIAGNOSIS — Z96652 Presence of left artificial knee joint: Secondary | ICD-10-CM | POA: Insufficient documentation

## 2018-04-13 DIAGNOSIS — L89302 Pressure ulcer of unspecified buttock, stage 2: Secondary | ICD-10-CM | POA: Diagnosis not present

## 2018-04-13 DIAGNOSIS — I471 Supraventricular tachycardia: Secondary | ICD-10-CM | POA: Insufficient documentation

## 2018-04-13 DIAGNOSIS — R0602 Shortness of breath: Secondary | ICD-10-CM

## 2018-04-13 DIAGNOSIS — I700 Atherosclerosis of aorta: Secondary | ICD-10-CM | POA: Diagnosis not present

## 2018-04-13 DIAGNOSIS — Z66 Do not resuscitate: Secondary | ICD-10-CM | POA: Diagnosis not present

## 2018-04-13 DIAGNOSIS — R778 Other specified abnormalities of plasma proteins: Secondary | ICD-10-CM

## 2018-04-13 DIAGNOSIS — N3000 Acute cystitis without hematuria: Secondary | ICD-10-CM | POA: Diagnosis not present

## 2018-04-13 DIAGNOSIS — I2699 Other pulmonary embolism without acute cor pulmonale: Secondary | ICD-10-CM

## 2018-04-13 DIAGNOSIS — I248 Other forms of acute ischemic heart disease: Secondary | ICD-10-CM | POA: Diagnosis not present

## 2018-04-13 DIAGNOSIS — R7989 Other specified abnormal findings of blood chemistry: Secondary | ICD-10-CM

## 2018-04-13 DIAGNOSIS — E785 Hyperlipidemia, unspecified: Secondary | ICD-10-CM | POA: Diagnosis not present

## 2018-04-13 DIAGNOSIS — F0390 Unspecified dementia without behavioral disturbance: Secondary | ICD-10-CM | POA: Insufficient documentation

## 2018-04-13 LAB — CBC WITH DIFFERENTIAL/PLATELET
BASOS ABS: 0 10*3/uL (ref 0–0.1)
BASOS PCT: 0 %
EOS ABS: 0.1 10*3/uL (ref 0–0.7)
EOS PCT: 1 %
HCT: 39 % (ref 35.0–47.0)
Hemoglobin: 13 g/dL (ref 12.0–16.0)
Lymphocytes Relative: 20 %
Lymphs Abs: 2.1 10*3/uL (ref 1.0–3.6)
MCH: 30.2 pg (ref 26.0–34.0)
MCHC: 33.2 g/dL (ref 32.0–36.0)
MCV: 91 fL (ref 80.0–100.0)
MONO ABS: 0.9 10*3/uL (ref 0.2–0.9)
MONOS PCT: 8 %
Neutro Abs: 7.6 10*3/uL — ABNORMAL HIGH (ref 1.4–6.5)
Neutrophils Relative %: 71 %
PLATELETS: 255 10*3/uL (ref 150–440)
RBC: 4.29 MIL/uL (ref 3.80–5.20)
RDW: 12.7 % (ref 11.5–14.5)
WBC: 10.8 10*3/uL (ref 3.6–11.0)

## 2018-04-13 LAB — COMPREHENSIVE METABOLIC PANEL
ALBUMIN: 2.9 g/dL — AB (ref 3.5–5.0)
ALT: 13 U/L (ref 0–44)
ANION GAP: 10 (ref 5–15)
AST: 19 U/L (ref 15–41)
Alkaline Phosphatase: 58 U/L (ref 38–126)
BILIRUBIN TOTAL: 1.7 mg/dL — AB (ref 0.3–1.2)
BUN: 10 mg/dL (ref 8–23)
CALCIUM: 8.6 mg/dL — AB (ref 8.9–10.3)
CO2: 31 mmol/L (ref 22–32)
Chloride: 96 mmol/L — ABNORMAL LOW (ref 98–111)
Creatinine, Ser: 0.31 mg/dL — ABNORMAL LOW (ref 0.44–1.00)
GFR calc Af Amer: >60 mL/min (ref >60)
GFR calc non Af Amer: >60 mL/min (ref >60)
GLUCOSE: 114 mg/dL — AB (ref 70–99)
Potassium: 3.7 mmol/L (ref 3.5–5.1)
Sodium: 137 mmol/L (ref 135–145)
TOTAL PROTEIN: 6.3 g/dL — AB (ref 6.5–8.1)

## 2018-04-13 LAB — URINALYSIS, COMPLETE (UACMP) WITH MICROSCOPIC
Bilirubin Urine: NEGATIVE
Glucose, UA: NEGATIVE mg/dL
Hgb urine dipstick: NEGATIVE
Ketones, ur: 5 mg/dL — AB
Nitrite: NEGATIVE
PROTEIN: NEGATIVE mg/dL
Specific Gravity, Urine: 1.013 (ref 1.005–1.030)
pH: 7 (ref 5.0–8.0)

## 2018-04-13 LAB — PROTIME-INR
INR: 0.98
PROTHROMBIN TIME: 12.9 s (ref 11.4–15.2)

## 2018-04-13 LAB — APTT: aPTT: 31 seconds (ref 24–36)

## 2018-04-13 LAB — BRAIN NATRIURETIC PEPTIDE: B NATRIURETIC PEPTIDE 5: 69 pg/mL (ref 0.0–100.0)

## 2018-04-13 LAB — TROPONIN I: Troponin I: 0.23 ng/mL (ref <0.03)

## 2018-04-13 MED ORDER — DOCUSATE SODIUM 100 MG PO CAPS
100.0000 mg | ORAL_CAPSULE | Freq: Two times a day (BID) | ORAL | Status: DC
  Administered 2018-04-14 (×2): 100 mg via ORAL
  Filled 2018-04-13 (×2): qty 1

## 2018-04-13 MED ORDER — ATORVASTATIN CALCIUM 10 MG PO TABS
10.0000 mg | ORAL_TABLET | Freq: Every day | ORAL | Status: DC
  Administered 2018-04-14: 10 mg via ORAL
  Filled 2018-04-13: qty 1

## 2018-04-13 MED ORDER — VITAMIN C 500 MG PO TABS
1000.0000 mg | ORAL_TABLET | Freq: Two times a day (BID) | ORAL | Status: DC
  Administered 2018-04-14 – 2018-04-15 (×3): 1000 mg via ORAL
  Filled 2018-04-13 (×5): qty 2

## 2018-04-13 MED ORDER — FUROSEMIDE 10 MG/ML IJ SOLN
40.0000 mg | Freq: Once | INTRAMUSCULAR | Status: AC
  Administered 2018-04-13: 40 mg via INTRAVENOUS
  Filled 2018-04-13: qty 4

## 2018-04-13 MED ORDER — ACETAMINOPHEN 650 MG RE SUPP: 650.0000 mg | Freq: Four times a day (QID) | RECTAL | Status: DC | PRN

## 2018-04-13 MED ORDER — CALCIUM CARBONATE-VITAMIN D 500-200 MG-UNIT PO TABS
1.0000 | ORAL_TABLET | Freq: Every day | ORAL | Status: DC
Start: 2018-04-14 — End: 2018-04-15
  Administered 2018-04-14 – 2018-04-15 (×2): 1 via ORAL
  Filled 2018-04-13 (×2): qty 1

## 2018-04-13 MED ORDER — HEPARIN SODIUM (PORCINE) 5000 UNIT/ML IJ SOLN: 5000.0000 [IU] | Freq: Three times a day (TID) | INTRAMUSCULAR | Status: DC

## 2018-04-13 MED ORDER — IOHEXOL 350 MG/ML SOLN
75.0000 mL | Freq: Once | INTRAVENOUS | Status: AC | PRN
  Administered 2018-04-13: 75 mL via INTRAVENOUS

## 2018-04-13 MED ORDER — FUROSEMIDE 40 MG PO TABS
40.0000 mg | ORAL_TABLET | Freq: Every day | ORAL | Status: DC
  Administered 2018-04-14 – 2018-04-15 (×2): 40 mg via ORAL
  Filled 2018-04-13 (×2): qty 1

## 2018-04-13 MED ORDER — ASPIRIN EC 325 MG PO TBEC
325.0000 mg | DELAYED_RELEASE_TABLET | Freq: Every day | ORAL | Status: DC
  Administered 2018-04-14: 325 mg via ORAL
  Filled 2018-04-13: qty 1

## 2018-04-13 MED ORDER — TRAZODONE HCL 50 MG PO TABS: 25.0000 mg | ORAL_TABLET | Freq: Every evening | ORAL | Status: DC | PRN

## 2018-04-13 MED ORDER — ADULT MULTIVITAMIN W/MINERALS CH
1.0000 | ORAL_TABLET | Freq: Every day | ORAL | Status: DC
  Administered 2018-04-14 – 2018-04-15 (×2): 1 via ORAL
  Filled 2018-04-13 (×2): qty 1

## 2018-04-13 MED ORDER — ONDANSETRON HCL 4 MG PO TABS: 4.0000 mg | ORAL_TABLET | Freq: Four times a day (QID) | ORAL | Status: DC | PRN

## 2018-04-13 MED ORDER — ACETAMINOPHEN 325 MG PO TABS
650.0000 mg | ORAL_TABLET | Freq: Four times a day (QID) | ORAL | Status: DC | PRN
  Administered 2018-04-14: 650 mg via ORAL
  Filled 2018-04-13: qty 2

## 2018-04-13 MED ORDER — LATANOPROST 0.005 % OP SOLN
1.0000 [drp] | Freq: Every day | OPHTHALMIC | Status: DC
  Administered 2018-04-14: 1 [drp] via OPHTHALMIC
  Filled 2018-04-13: qty 2.5

## 2018-04-13 MED ORDER — VITAMIN B-12 100 MCG PO TABS
100.0000 ug | ORAL_TABLET | Freq: Every day | ORAL | Status: DC
  Administered 2018-04-14 – 2018-04-15 (×2): 100 ug via ORAL
  Filled 2018-04-13 (×2): qty 1

## 2018-04-13 MED ORDER — VITAMIN-B COMPLEX PO TABS: 1.0000 | ORAL_TABLET | Freq: Every day | ORAL | Status: DC

## 2018-04-13 MED ORDER — TRAMADOL HCL 50 MG PO TABS: 50.0000 mg | ORAL_TABLET | Freq: Four times a day (QID) | ORAL | Status: DC | PRN

## 2018-04-13 MED ORDER — ONDANSETRON HCL 4 MG/2ML IJ SOLN: 4.0000 mg | Freq: Four times a day (QID) | INTRAMUSCULAR | Status: DC | PRN

## 2018-04-13 MED ORDER — RISAQUAD PO CAPS
1.0000 | ORAL_CAPSULE | Freq: Every day | ORAL | Status: DC
Start: 2018-04-14 — End: 2018-04-15
  Administered 2018-04-14 – 2018-04-15 (×2): 1 via ORAL
  Filled 2018-04-13 (×2): qty 1

## 2018-04-13 MED ORDER — ZINC SULFATE 220 (50 ZN) MG PO CAPS
220.0000 mg | ORAL_CAPSULE | Freq: Every evening | ORAL | Status: DC
Start: 2018-04-14 — End: 2018-04-15
  Administered 2018-04-14: 220 mg via ORAL
  Filled 2018-04-13 (×2): qty 1

## 2018-04-13 MED ORDER — HEPARIN (PORCINE) IN NACL 100-0.45 UNIT/ML-% IJ SOLN
1000.0000 [IU]/h | INTRAMUSCULAR | Status: DC
  Administered 2018-04-13: 1150 [IU]/h via INTRAVENOUS
  Administered 2018-04-14: 1000 [IU]/h via INTRAVENOUS
  Filled 2018-04-13 (×2): qty 250

## 2018-04-13 MED ORDER — MAGNESIUM OXIDE 400 (241.3 MG) MG PO TABS
200.0000 mg | ORAL_TABLET | Freq: Every day | ORAL | Status: DC
  Administered 2018-04-14: 200 mg via ORAL
  Filled 2018-04-13: qty 1

## 2018-04-13 MED ORDER — CRANBERRY-VITAMIN C-VITAMIN E 4200-20-3 MG-MG-UNIT PO CAPS
2.0000 | ORAL_CAPSULE | Freq: Every day | ORAL | Status: DC
Start: 2018-04-14 — End: 2018-04-13

## 2018-04-13 MED ORDER — VITAMIN E 180 MG (400 UNIT) PO CAPS
400.0000 [IU] | ORAL_CAPSULE | Freq: Every evening | ORAL | Status: DC
  Administered 2018-04-14: 400 [IU] via ORAL
  Filled 2018-04-13 (×2): qty 1

## 2018-04-13 MED ORDER — HEPARIN SODIUM (PORCINE) 5000 UNIT/ML IJ SOLN
INTRAMUSCULAR | Status: AC
  Filled 2018-04-13: qty 1

## 2018-04-13 MED ORDER — HEPARIN BOLUS VIA INFUSION
4000.0000 [IU] | Freq: Once | INTRAVENOUS | Status: AC
  Administered 2018-04-13: 4000 [IU] via INTRAVENOUS
  Filled 2018-04-13: qty 4000

## 2018-04-13 MED ORDER — BISACODYL 5 MG PO TBEC: 5.0000 mg | DELAYED_RELEASE_TABLET | Freq: Every day | ORAL | Status: DC | PRN

## 2018-04-13 NOTE — ED Notes (Signed)
Purewick catheter placed per verbal order from Dr. Mayford KnifeWilliams.

## 2018-04-13 NOTE — ED Triage Notes (Signed)
Pt d/c to home yesterday with L side femur fracture with daughters. Medic called today because pt stated she was having difficulty breathing.

## 2018-04-13 NOTE — ED Provider Notes (Signed)
[facility] Emergency Department Provider Note       Time seen: ----------------------------------------- 5:37 PM on 04/13/2018 -----------------------------------------   I have reviewed the triage vital signs and the nursing notes.  HISTORY   Chief Complaint No chief complaint on file.    HPI Kristin Graham is a 84 y.o. female with a history of depression, peripheral edema, osteoporosis, urinary incontinence, CVA who presents to the ED for difficulty breathing.  Patient reportedly was just admitted to the hospital for a hip fracture and the decision was made not to repair the left hip.  She was subsequently taken home and has had little care since then according to EMS.  After going home she had trouble swallowing, she swallowed some of her medication in applesauce and there was concerned that she aspirated.  She is complaining of shortness of breath and not being able to breathe.  Past Medical History:  Diagnosis Date  . Bilateral carpal tunnel syndrome    S/P surgery  . Depression    unspecified  . Foot fracture, right   . Glaucoma   . Left leg weakness    Progressive, evaluated by Ortho. LS spine MRI with enhancing mass involving L2 vertebra. oncology evaluation with Dr. Doylene Canning 6/11; bone scan negative; PET scan negative. 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 Neurosurgery consultation arranged by Oncology.  . Left shoulder tendinitis    with partial rotator cuff tear evaluated by Dr. Erin Sons  . Leg edema    likely venous insufficiency. Declines compression hose. Declines physical therapy. Echo 3/10 with normal heart function, mild MR.  . Osteoarthritis   . Osteoporosis, post-menopausal   . Stroke (HCC)    History of CVA versus TIA, 1996, with reported episodes of TIA since then. Left pontine CVA by MRI 12/14.  [name] Urinary incontinence   . Vision loss of left eye    followed by Dr. Fransico Michael.  Possibly glaucoma-induced versus ischemic neuropathy from vascular disease.    Patient Active Problem List   Diagnosis Date Noted  . Femur fracture, left (HCC) 04/10/2018  . Gastroenteritis 12/28/2017  . Pressure injury of skin 12/28/2017  . Protein-calorie malnutrition (HCC) 07/26/2017  . Hip pain 07/09/2017  . Pelvic fracture (HCC) 07/08/2017  . Cataract 07/08/2017  . CHF (congestive heart failure) (HCC) 07/08/2017  . Dyslipidemia 07/08/2017  . TIA (transient ischemic attack) 07/08/2017    Past Surgical History:  Procedure Laterality Date  . CARPAL TUNNEL RELEASE  2003  . CATARACT EXTRACTION, BILATERAL  2000  . CHOLECYSTECTOMY    . JOINT REPLACEMENT  07/2005   left total knee, Dr. Erin Sons    Allergies Bee venom; Chocolate; Nsaids; and Vicodin [hydrocodone-acetaminophen]  Social History Social History   Tobacco Use  . Smoking status: Never Smoker  . Smokeless tobacco: Never Used  Substance Use Topics  . Alcohol use: No  . Drug use: No   Review of Systems Constitutional: Negative for fever. Eyes: Negative for vision changes ENT:  Negative for congestion, sore throat Cardiovascular: Negative for chest pain. Respiratory: Negative for shortness of breath. Gastrointestinal: Negative for abdominal pain, vomiting and diarrhea. Genitourinary: Negative for dysuria. Musculoskeletal: Negative for back pain. Skin: Negative for rash. Neurological: Negative for headaches, focal weakness or numbness.  All systems negative/normal/unremarkable except as stated in the HPI  ____________________________________________   PHYSICAL EXAM:  VITAL SIGNS: ED Triage Vitals  Enc Vitals Group     BP  Pulse      Resp      Temp      Temp src      SpO2      Weight      Height      Head Circumference      Peak Flow      Pain Score      Pain Loc      Pain Edu?      Excl. in GC?    Constitutional: Lethargic, mild distress Eyes: Conjunctivae are normal. Normal  extraocular movements. ENT   Head: Normocephalic and atraumatic.   Nose: No congestion/rhinnorhea.   Mouth/Throat: Mucous membranes are moist.   Neck: No stridor. Cardiovascular: Normal rate, regular rhythm. No murmurs, rubs, or gallops. Respiratory: No respiratory distress, scattered rhonchi Gastrointestinal: Soft and nontender. Normal bowel sounds Musculoskeletal: Left lower extremity is immobilized.  No obvious edema is noted Neurologic:  Normal speech and language. No gross focal neurologic deficits are appreciated.  Skin:  Skin is warm, dry and intact. No rash noted. Psychiatric: Mood and affect are normal. Speech and behavior are normal.  ____________________________________________  EKG: Interpreted by me.  Sinus rhythm the rate of 90 bpm, prolonged PR interval, left axis deviation, QT  ____________________________________________  ED COURSE:  As part of my medical decision making, I reviewed the following data within the electronic MEDICAL RECORD NUMBER History obtained from family if available, nursing notes, old chart and ekg, as well as notes from prior ED visits. Patient presented for weakness, we will assess with labs and imaging as indicated at this time.   Procedures ____________________________________________   LABS (pertinent positives/negatives)  Labs Reviewed  CBC WITH DIFFERENTIAL/PLATELET - Abnormal; Notable for the following components:      Result Value   Neutro Abs 7.6 (*)    All other components within normal limits  COMPREHENSIVE METABOLIC PANEL - Abnormal; Notable for the following components:   Chloride 96 (*)    Glucose, Bld 114 (*)    Creatinine, Ser 0.31 (*)    Calcium 8.6 (*)    Total Protein 6.3 (*)    Albumin 2.9 (*)    Total Bilirubin 1.7 (*)    All other components within normal limits  TROPONIN I - Abnormal; Notable for the following components:   Troponin I 0.23 (*)    All other components within normal limits  BRAIN  NATRIURETIC PEPTIDE  URINALYSIS, COMPLETE (UACMP) WITH MICROSCOPIC  APTT  PROTIME-INR  CBG MONITORING, ED   CRITICAL CARE Performed by: Ulice DashJohnathan E Williams   Total critical care time: 30 minutes  Critical care time was exclusive of separately billable procedures and treating other patients.  Critical care was necessary to treat or prevent imminent or life-threatening deterioration.  Critical care was time spent personally by me on the following activities: development of treatment plan with patient and/or surrogate as well as nursing, discussions with consultants, evaluation of patient's response to treatment, examination of patient, obtaining history from patient or surrogate, ordering and performing treatments and interventions, ordering and review of laboratory studies, ordering and review of radiographic studies, pulse oximetry and re-evaluation of patient's condition.  RADIOLOGY  Chest x-ray IMPRESSION: 1. Small pleural effusions with bibasilar atelectasis or pneumonia. 2. Stable borderline to mild cardiomegaly. CT angiogram of the chest IMPRESSION: 1. Thickening at the distal right pulmonary artery and inter lobar pulmonary artery with several web like defects, suspect for chronic PE. More acute appearing small emboli within subsegmental right lower lobe pulmonary branch  vessels. 2. Small right pleural effusion and atelectasis at the right base 3. 10 cm right middle lobe pulmonary nodule. Consider one of the following in 3 months for both low-risk and high-risk individuals: (a) repeat chest CT, (b) follow-up PET-CT, or (c) tissue sampling. This recommendation follows the consensus statement: Guidelines for Management of Incidental Pulmonary Nodules Detected on CT Images: From the Fleischner Society 2017; Radiology 2017; 284:228-243.  Critical Value/emergent results were called by telephone at the time of interpretation on 04/13/2018 at 8:24 pm to Dr. Daryel November  , who verbally acknowledged these results.  Aortic Atherosclerosis (ICD10-I70.0). ____________________________________________  DIFFERENTIAL DIAGNOSIS   Aspiration, pneumonia, CHF, dehydration, occult infection, electrolyte abnormality  FINAL ASSESSMENT AND PLAN  Dyspnea, CHF exacerbation, elevated troponin, acute and chronic pulmonary emboli   Plan: The patient had presented for shortness of breath. Patient's labs do indicate significantly elevated troponin 0.23. Patient's imaging revealed bilateral pleural effusions.  We did give IV Lasix as well as oral aspirin.  She did have CT scan findings concerning for acute and chronic pulmonary emboli.  I have ordered IV heparin for her.  I will discuss with the hospitalist for admission.   Ulice Dash, MD   Note: This note was generated in part or whole with voice recognition software. Voice recognition is usually quite accurate but there are transcription errors that can and very often do occur. I apologize for any typographical errors that were not detected and corrected.     Emily Filbert, MD 04/13/18 2029

## 2018-04-13 NOTE — ED Notes (Signed)
Pt to Xray at this time

## 2018-04-13 NOTE — ED Notes (Signed)
Date and time results received: 04/13/18 1907  Test: Troponin Critical Value: 0.23  Name of Provider Notified: Mayford KnifeWilliams  Orders Received? Or Actions Taken?: Pt moved from hallway to room 6.

## 2018-04-13 NOTE — Consult Note (Signed)
ANTICOAGULATION CONSULT NOTE - Initial Consult  Pharmacy Consult for Heparin Drip  Indication: pulmonary embolus  Allergies  Allergen Reactions  . Bee Venom Swelling  . Chocolate   . Nsaids   . Vicodin [Hydrocodone-Acetaminophen]     Patient Measurements:   Heparin Dosing Weight: 70kg  Vital Signs: Temp: 99.2 F (37.3 C) (07/05 1736) Temp Source: Oral (07/05 1736) BP: 110/85 (07/05 1736) Pulse Rate: 92 (07/05 1736)  Labs: Recent Labs    04/13/18 1820  HGB 13.0  HCT 39.0  PLT 255  CREATININE 0.31*  TROPONINI 0.23*    Estimated Creatinine Clearance: 37.1 mL/min (A) (by C-G formula based on SCr of 0.31 mg/dL (L)).   Medical History: Past Medical History:  Diagnosis Date  . Bilateral carpal tunnel syndrome    S/P surgery  . Depression    unspecified  . Foot fracture, right   . Glaucoma   . Left leg weakness    Progressive, evaluated by Ortho. LS spine MRI with enhancing mass involving L2 vertebra. oncology evaluation with Dr. Doylene Canninghoksi 6/11; bone scan negative; PET scan negative. 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 Neurosurgery consultation arranged by Oncology.  . Left shoulder tendinitis    with partial rotator cuff tear evaluated by Dr. Erin SonsHarold Kernodle  . Leg edema    likely venous insufficiency. Declines compression hose. Declines physical therapy. Echo 3/10 with normal heart function, mild MR.  . Osteoarthritis   . Osteoporosis, post-menopausal   . Stroke (HCC)    History of CVA versus TIA, 1996, with reported episodes of TIA since then. Left pontine CVA by MRI 12/14.  [name]. Urinary incontinence   . Vision loss of left eye    followed by Dr. Fransico MichaelBrennan. Possibly glaucoma-induced versus ischemic neuropathy from vascular disease.   Assessment: Pharmacy consulted for heparin dosing for 84yo female admitted with PE. Patient was recently admitted to hospital for femur fracture.   Goal of Therapy:  Heparin level 0.3-0.7  units/ml Monitor platelets by anticoagulation protocol: Yes   Plan:  Baseline labs ordered Give 4000 units bolus x 1 Start heparin infusion at 1150 units/hr Check anti-Xa level in 8 hours and daily while on heparin Continue to monitor H&H and platelets  Gardner CandleSheema M Navarre Diana, PharmD, BCPS Clinical Pharmacist 04/13/2018 8:38 PM

## 2018-04-13 NOTE — ED Notes (Signed)
Report given to Tasha, RN.

## 2018-04-13 NOTE — H&P (Addendum)
[facility] - [place] at [facility]   PATIENT NAME: Kristin Graham    MR#:  [id number]  DATE OF BIRTH:  [birth year]  DATE OF ADMISSION:  04/13/2018  PRIMARY CARE PHYSICIAN: Lynnea Ferrier, MD   REQUESTING/REFERRING PHYSICIAN:   CHIEF COMPLAINT:   Chief Complaint  Patient presents with  . Respiratory Distress    HISTORY OF PRESENT ILLNESS: Kristin Graham  is a 84 y.o. female with a known history of dementia, CVA, urinary incontinence, vision loss and most recently status post left hip fracture, which was not repaired, per patient's decision. Patient is a very poor historian due to her dementia, unable to provide reliable history.  Most of the information was taken from reviewing the medical chart and from discussion with emergency room physician. Patient was brought to emergency room for shortness of breath and trouble swallowing, going on for the past 24 hours, gradually getting worse.  Per family, they were afraid that she aspirated.  No reports of fever or chills, no nausea, vomiting, diarrhea, no bleeding. Blood tests show elevated troponin level is 0.23.  UA is positive for UTI. CT Angio of the chest shows acute and chronic pulmonary emboli. Patient is admitted for further evaluation and treatment.  PAST MEDICAL HISTORY:   Past Medical History:  Diagnosis Date  . Bilateral carpal tunnel syndrome    S/P surgery  . Depression    unspecified  . Foot fracture, right   . Glaucoma   . Left leg weakness    Progressive, evaluated by Ortho. LS spine MRI with enhancing mass involving L2 vertebra. oncology evaluation with Dr. Doylene Canning 6/11; bone scan negative; PET scan negative. 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 Neurosurgery consultation arranged by Oncology.  . Left shoulder tendinitis    with partial rotator cuff tear evaluated by Dr. Erin Sons  . Leg edema    likely venous insufficiency. Declines  compression hose. Declines physical therapy. Echo 3/10 with normal heart function, mild MR.  . Osteoarthritis   . Osteoporosis, post-menopausal   . Stroke (HCC)    History of CVA versus TIA, 1996, with reported episodes of TIA since then. Left pontine CVA by MRI 12/14.  [name] Urinary incontinence   . Vision loss of left eye    followed by Dr. Fransico Michael. Possibly glaucoma-induced versus ischemic neuropathy from vascular disease.    PAST SURGICAL HISTORY:  Past Surgical History:  Procedure Laterality Date  . CARPAL TUNNEL RELEASE  2003  . CATARACT EXTRACTION, BILATERAL  2000  . CHOLECYSTECTOMY    . JOINT REPLACEMENT  07/2005   left total knee, Dr. Erin Sons    SOCIAL HISTORY:  Social History   Tobacco Use  . Smoking status: Never Smoker  . Smokeless tobacco: Never Used  Substance Use Topics  . Alcohol use: No    FAMILY HISTORY:  Family History  Problem Relation Age of Onset  . Osteoporosis Mother   . Rheum arthritis Mother   . Stroke Mother   . Cancer Father        stomach and rectal  . Osteoporosis Sister   . Osteoporosis Sister     DRUG ALLERGIES:  Allergies  Allergen Reactions  . Bee Venom Swelling  . Chocolate   . Nsaids   . Vicodin [Hydrocodone-Acetaminophen]     REVIEW OF SYSTEMS:   Unable to obtain due, to patient's dementia.   MEDICATIONS AT HOME:  Prior to Admission medications   Medication  Sig Start Date End Date Taking? Authorizing Provider  acetaminophen (TYLENOL) 500 MG tablet Take 250 mg by mouth every 6 (six) hours as needed for mild pain or moderate pain. 1/2 tablet per home routine   Yes [provider]  ascorbic acid (VITAMIN C) 1000 MG tablet Take 1,000 mg by mouth 2 (two) times daily.   Yes [provider]  aspirin 325 MG tablet Take 325 mg by mouth daily.    Yes [provider]  atorvastatin (LIPITOR) 10 MG tablet Take 10 mg by mouth at bedtime.    Yes [provider]  B Complex Vitamins (VITAMIN-B  COMPLEX) TABS Take 1 tablet by mouth daily with breakfast.    Yes [provider]  bimatoprost (LUMIGAN) 0.03 % ophthalmic solution Place 1 drop into both eyes at bedtime.    Yes [provider]  calcium-vitamin D (OSCAL WITH D) 500-200 MG-UNIT tablet Take 1 tablet by mouth daily with breakfast.    Yes [provider]  camphor-menthol (SARNA) lotion Apply 1 application topically daily as needed for itching. Apply to affected area   Yes [provider]  Cranberry-Vitamin C-Vitamin E (CRANBERRY PLUS VITAMIN C) 4200-20-3 MG-MG-UNIT CAPS Take 2 capsules by mouth daily.    Yes [provider]  docusate sodium (COLACE) 100 MG capsule Take 100 mg by mouth daily as needed for mild constipation.    Yes [provider]  dorzolamide-timolol (COSOPT) 22.3-6.8 MG/ML ophthalmic solution Place 1 drop into both eyes 2 (two) times daily.    Yes [provider]  furosemide (LASIX) 20 MG tablet Take 30 mg by mouth daily.    Yes [provider]  magnesium oxide (MAG-OX) 400 MG tablet Take 100 mg by mouth at bedtime.    Yes [provider]  Multiple Vitamins-Minerals (CENTRUM SILVER) tablet Take 1 tablet by mouth daily.   Yes [provider]  Probiotic Product (RISA-BID PROBIOTIC) TABS Take 1 tablet by mouth daily.   Yes [provider]  traMADol (ULTRAM) 50 MG tablet Take 50 mg by mouth every 6 (six) hours as needed.   Yes [provider]  vitamin B-12 (CYANOCOBALAMIN) 100 MCG tablet Take 100 mcg by mouth daily.   Yes [provider]  Vitamin E 400 units TABS Take 1 tablet by mouth every evening.    Yes [provider]  Zinc Sulfate (ZINC-220 PO) Take 1 tablet by mouth every evening.    Yes [provider]      PHYSICAL EXAMINATION:   VITAL SIGNS: Blood pressure 106/76, pulse 89, temperature 99.2 F (37.3 C), temperature source Oral, resp. rate 19, SpO2 100 %.  GENERAL:  84  y.o.-year-old patient lying in the bed with no acute distress.  EYES: Pupils equal, round, reactive to light and accommodation. No scleral icterus.  Vision loss noted. HEENT: Head atraumatic, normocephalic. Oropharynx and nasopharynx clear.  NECK:  Supple, no jugular venous distention. No thyroid enlargement, no tenderness.  LUNGS: Reduced breath sounds bilaterally, no wheezing. No use of accessory muscles of respiration.  CARDIOVASCULAR: S1, S2 normal. No murmurs, rubs, or gallops.  ABDOMEN: Soft, nontender, nondistended. Bowel sounds present. No organomegaly or mass.  EXTREMITIES: Left hip pain with range of motion due to left hip fracture. NEUROLOGIC: No focal weakness. PSYCHIATRIC: The patient is alert and oriented x 3.  SKIN: No obvious rash, lesion, or ulcer.   LABORATORY PANEL:   CBC Recent Labs  Lab 04/10/18 0046 04/13/18 1820  WBC 14.0* 10.8  HGB 14.9 13.0  HCT 45.9 39.0  PLT 245 255  MCV 91.6 91.0  MCH 29.8 30.2  MCHC 32.5 33.2  RDW 12.7 12.7  LYMPHSABS  --  2.1  MONOABS  --  0.9  EOSABS  --  0.1  BASOSABS  --  0.0   ------------------------------------------------------------------------------------------------------------------  Chemistries  Recent Labs  Lab 04/10/18 0046 04/13/18 1820  NA 142 137  K 3.6 3.7  CL 102 96*  CO2 32 31  GLUCOSE 140* 114*  BUN 11 10  CREATININE 0.31* 0.31*  CALCIUM 8.8* 8.6*  AST 21 19  ALT 15 13  ALKPHOS 55 58  BILITOT 1.0 1.7*   ------------------------------------------------------------------------------------------------------------------ estimated creatinine clearance is 37.1 mL/min (A) (by C-G formula based on SCr of 0.31 mg/dL (L)). ------------------------------------------------------------------------------------------------------------------ No results for input(s): TSH, T4TOTAL, T3FREE, THYROIDAB in the last 72 hours.  Invalid input(s): FREET3   Coagulation profile Recent Labs  Lab 04/13/18 2107  INR  0.98   ------------------------------------------------------------------------------------------------------------------- No results for input(s): DDIMER in the last 72 hours. -------------------------------------------------------------------------------------------------------------------  Cardiac Enzymes Recent Labs  Lab 04/13/18 1820  TROPONINI 0.23*   ------------------------------------------------------------------------------------------------------------------ Invalid input(s): POCBNP  ---------------------------------------------------------------------------------------------------------------  Urinalysis    Component Value Date/Time   COLORURINE YELLOW (A) 04/13/2018 2022   APPEARANCEUR HAZY (A) 04/13/2018 2022   APPEARANCEUR Turbid 09/29/2013 1717   LABSPEC 1.013 04/13/2018 2022   LABSPEC 1.023 09/29/2013 1717   PHURINE 7.0 04/13/2018 2022   GLUCOSEU NEGATIVE 04/13/2018 2022   GLUCOSEU Negative 09/29/2013 1717   HGBUR NEGATIVE 04/13/2018 2022   BILIRUBINUR NEGATIVE 04/13/2018 2022   BILIRUBINUR Negative 09/29/2013 1717   KETONESUR 5 (A) 04/13/2018 2022   PROTEINUR NEGATIVE 04/13/2018 2022   NITRITE NEGATIVE 04/13/2018 2022   LEUKOCYTESUR LARGE (A) 04/13/2018 2022   LEUKOCYTESUR 2+ 09/29/2013 1717     RADIOLOGY: Dg Chest 2 View  Result Date: 04/13/2018 CLINICAL DATA:  Dyspnea, left femur fracture EXAM: CHEST - 2 VIEW COMPARISON:  12/29/2017 FINDINGS: There are small bilateral pleural effusions. Bibasilar airspace disease. Stable borderline cardiomegaly. Aortic atherosclerosis. No pneumothorax. Calcified nodule at the left lung apex. Multiple old left-sided rib fractures. IMPRESSION: 1. Small pleural effusions with bibasilar atelectasis or pneumonia. 2. Stable borderline to mild cardiomegaly. Electronically Signed   By: Jasmine Pang M.D.   On: 04/13/2018 17:59   Ct Angio Chest Pe W And/or Wo Contrast  Result Date: 04/13/2018 CLINICAL DATA:  Left femur fracture  difficulty breathing EXAM: CT ANGIOGRAPHY CHEST WITH CONTRAST TECHNIQUE: Multidetector CT imaging of the chest was performed using the standard protocol during bolus administration of intravenous contrast. Multiplanar CT image reconstructions and MIPs were obtained to evaluate the vascular anatomy. CONTRAST:  75mL OMNIPAQUE IOHEXOL 350 MG/ML SOLN COMPARISON:  Chest x-ray 04/13/2018, CT abdomen 12/28/2017 PET CT 04/07/2010 FINDINGS: Cardiovascular: Satisfactory opacification of the pulmonary arteries to the segmental level. Eccentric thrombus within the right inter lobar pulmonary artery and distal main right pulmonary artery. Web like defects within the right inter lobar pulmonary artery consistent with sequela of chronic PE. More acute nonocclusive filling defects within subsegmental right lower lobe pulmonary branch vessels, for example series 5, image number 176 and series 5, image number 160. No definite acute filling defects on the left. Heterogeneous density within the aorta is felt secondary to admixing of contrast. Ectatic ascending aorta without aneurysm. Moderate aortic atherosclerosis. Coronary vascular calcification. Borderline heart size. No significant pericardial effusion. Mediastinum/Nodes: Midline trachea. Heterogeneous thyroid. 13 mm hypodensity inferior to the left bronchus, no change. Esophagus within normal limits.  Lungs/Pleura: Small right-sided pleural effusion. Passive atelectasis at the right base. Nodular densities within the apices of both lungs, no change since 2011 and presumably due to scarring. New 10 mm right middle lobe pulmonary nodule, series 6, image number 71. Upper Abdomen: No acute abnormality. Musculoskeletal: Ankylosis of the spine. No acute osseous abnormality. Sternum is intact. Similar appearance of bulky soft tissue and bony changes at the bilateral sternoclavicular joints. Review of the MIP images confirms the above findings. IMPRESSION: 1. Thickening at the distal right  pulmonary artery and inter lobar pulmonary artery with several web like defects, suspect for chronic PE. More acute appearing small emboli within subsegmental right lower lobe pulmonary branch vessels. 2. Small right pleural effusion and atelectasis at the right base 3. 10 cm right middle lobe pulmonary nodule. Consider one of the following in 3 months for both low-risk and high-risk individuals: (a) repeat chest CT, (b) follow-up PET-CT, or (c) tissue sampling. This recommendation follows the consensus statement: Guidelines for Management of Incidental Pulmonary Nodules Detected on CT Images: From the Fleischner Society 2017; Radiology 2017; 284:228-243. Critical Value/emergent results were called by telephone at the time of interpretation on 04/13/2018 at 8:24 pm to Dr. Daryel NovemberJONATHAN WILLIAMS , who verbally acknowledged these results. Aortic Atherosclerosis (ICD10-I70.0). Electronically Signed   By: Jasmine PangKim  Fujinaga M.D.   On: 04/13/2018 20:24    EKG: Orders placed or performed during the hospital encounter of 04/13/18  . EKG 12-Lead  . EKG 12-Lead    IMPRESSION AND PLAN:  1.  Acute respiratory distress, likely secondary to PE. 2.  Acute on chronic PE, patient was started on heparin drip. 3.  Elevated troponin level.  First troponin level is elevated at 0.23.  This could be related to demand ischemia, from PE. We will continue to monitor patient on telemetry and follow troponin levels to rule out ACS.  Will check 2D echo. 4.  Acute UTI.  We will start ceftriaxone IV, while waiting for urine culture results. 5.  Subacute left hip fracture.  Patient refused surgical repair.  We will continue conservative management with pain control.  Overall, patient has poor long-term prognosis.  Palliative care team is consulted.  All the records are reviewed and case discussed with ED provider. Management plans discussed with the patient, family and they are in agreement.  CODE STATUS: DNR Code Status History     Date Active Date Inactive Code Status Order ID Comments User Context   04/10/2018 1249 04/12/2018 1715 DNR [id number]  Ramonita LabGouru, Aruna, MD Inpatient   04/10/2018 0309 04/10/2018 1249 Full Code [id number]  Arnaldo Nataliamond, Michael S, MD ED   12/28/2017 0442 12/31/2017 1521 Full Code [id number]  Arnaldo Nataliamond, Michael S, MD Inpatient   07/08/2017 0352 07/10/2017 2056 Full Code [id number]  Gery Prayrosley, Debby, MD ED    Questions for Most Recent Historical Code Status (Order [id number])    Question Answer Comment   In the event of cardiac or respiratory ARREST Do not call a "code blue"    In the event of cardiac or respiratory ARREST Do not perform Intubation, CPR, defibrillation or ACLS    In the event of cardiac or respiratory ARREST Use medication by any route, position, wound care, and other measures to relive pain and suffering. May use oxygen, suction and manual treatment of airway obstruction as needed for comfort.    Comments rN may pronounce        TOTAL TIME TAKING CARE OF THIS PATIENT: 45 minutes.    Cammy CopaAngela Nivin Braniff M.D  on 04/13/2018 at 10:35 PM  Between 7am to 6pm - Pager - [phone]  After 6pm go to [email or web address] - password [email or web address] [facility]  [facility] [facility]  Office  [phone]  CC: Primary care physician; Lynnea Ferrier, MD

## 2018-04-14 ENCOUNTER — Observation Stay: Payer: Medicare Other

## 2018-04-14 ENCOUNTER — Observation Stay
Admit: 2018-04-14 | Discharge: 2018-04-14 | Disposition: A | Discharge status: Home / Self Care | Payer: Medicare Other | Attending: Internal Medicine | Admitting: Internal Medicine

## 2018-04-14 LAB — CBC
HCT: 36.9 % (ref 35.0–47.0)
Hemoglobin: 12.6 g/dL (ref 12.0–16.0)
MCH: 30.7 pg (ref 26.0–34.0)
MCHC: 34 g/dL (ref 32.0–36.0)
MCV: 90.3 fL (ref 80.0–100.0)
PLATELETS: 250 10*3/uL (ref 150–440)
RBC: 4.09 MIL/uL (ref 3.80–5.20)
RDW: 12.4 % (ref 11.5–14.5)
WBC: 11.4 10*3/uL — AB (ref 3.6–11.0)

## 2018-04-14 LAB — BASIC METABOLIC PANEL
ANION GAP: 11 (ref 5–15)
BUN: 14 mg/dL (ref 8–23)
CALCIUM: 8.3 mg/dL — AB (ref 8.9–10.3)
CO2: 30 mmol/L (ref 22–32)
Chloride: 97 mmol/L — ABNORMAL LOW (ref 98–111)
Creatinine, Ser: 0.41 mg/dL — ABNORMAL LOW (ref 0.44–1.00)
Glucose, Bld: 178 mg/dL — ABNORMAL HIGH (ref 70–99)
POTASSIUM: 3 mmol/L — AB (ref 3.5–5.1)
Sodium: 138 mmol/L (ref 135–145)

## 2018-04-14 LAB — TROPONIN I: TROPONIN I: 0.22 ng/mL — AB (ref <0.03)

## 2018-04-14 LAB — HEPARIN LEVEL (UNFRACTIONATED)
HEPARIN UNFRACTIONATED: 0.74 [IU]/mL — AB (ref 0.30–0.70)
Heparin Unfractionated: 0.62 IU/mL (ref 0.30–0.70)

## 2018-04-14 LAB — GLUCOSE, CAPILLARY
Glucose-Capillary: 119 mg/dL — ABNORMAL HIGH (ref 70–99)
Glucose-Capillary: 218 mg/dL — ABNORMAL HIGH (ref 70–99)

## 2018-04-14 LAB — ECHOCARDIOGRAM COMPLETE
HEIGHTINCHES: 64 in
WEIGHTICAEL: 2436.8 [oz_av]

## 2018-04-14 MED ORDER — APIXABAN 2.5 MG PO TABS
2.5000 mg | ORAL_TABLET | Freq: Two times a day (BID) | ORAL | Status: DC
  Administered 2018-04-14: 2.5 mg via ORAL
  Filled 2018-04-14: qty 1

## 2018-04-14 MED ORDER — SODIUM CHLORIDE 0.9 % IV SOLN
1.0000 g | INTRAVENOUS | Status: DC
  Administered 2018-04-14: 1 g via INTRAVENOUS
  Filled 2018-04-14: qty 10

## 2018-04-14 MED ORDER — POTASSIUM CHLORIDE CRYS ER 20 MEQ PO TBCR
40.0000 meq | EXTENDED_RELEASE_TABLET | Freq: Once | ORAL | Status: AC
  Administered 2018-04-14: 40 meq via ORAL
  Filled 2018-04-14: qty 2

## 2018-04-14 MED ORDER — MORPHINE SULFATE (PF) 2 MG/ML IV SOLN: 2.0000 mg | Freq: Once | INTRAVENOUS | Status: DC

## 2018-04-14 MED ORDER — B COMPLEX-C PO TABS
1.0000 | ORAL_TABLET | Freq: Every day | ORAL | Status: DC
  Administered 2018-04-14 – 2018-04-15 (×2): 1 via ORAL
  Filled 2018-04-14 (×2): qty 1

## 2018-04-14 MED ORDER — ACETAMINOPHEN 325 MG PO TABS
650.0000 mg | ORAL_TABLET | Freq: Four times a day (QID) | ORAL | Status: DC
  Administered 2018-04-14 – 2018-04-15 (×5): 650 mg via ORAL
  Filled 2018-04-14 (×4): qty 2

## 2018-04-14 MED ORDER — DORZOLAMIDE HCL-TIMOLOL MAL 2-0.5 % OP SOLN
1.0000 [drp] | Freq: Two times a day (BID) | OPHTHALMIC | Status: DC
  Administered 2018-04-14 – 2018-04-15 (×2): 1 [drp] via OPHTHALMIC
  Filled 2018-04-14: qty 10

## 2018-04-14 MED ORDER — CEFTRIAXONE SODIUM 1 G IJ SOLR
1.0000 g | INTRAMUSCULAR | Status: DC
  Filled 2018-04-14: qty 10

## 2018-04-14 MED ORDER — SODIUM CHLORIDE 0.9 % IV SOLN
1.0000 g | INTRAVENOUS | Status: DC
  Administered 2018-04-15: 1 g via INTRAVENOUS
  Filled 2018-04-14: qty 10
  Filled 2018-04-14: qty 1

## 2018-04-14 NOTE — Progress Notes (Signed)
ANTICOAGULATION CONSULT NOTE - Follow Up Consult  Pharmacy Consult for Apixaban dosing Indication: pulmonary embolus  Allergies  Allergen Reactions  . Bee Venom Swelling  . Chocolate   . Nsaids   . Vicodin [Hydrocodone-Acetaminophen]     Patient Measurements: Height: 5\' 4"  (162.6 cm) Weight: 152 lb 4.8 oz (69.1 kg) IBW/kg (Calculated) : 54.7 Heparin Dosing Weight:    Vital Signs: Temp: 98 F (36.7 C) (07/06 0800) Temp Source: Oral (07/06 0800) BP: 117/68 (07/06 0800) Pulse Rate: 96 (07/06 0800)  Labs: Recent Labs    04/13/18 1820 04/13/18 2107 04/14/18 0543  HGB 13.0  --  12.6  HCT 39.0  --  36.9  PLT 255  --  250  APTT  --  31  --   LABPROT  --  12.9  --   INR  --  0.98  --   HEPARINUNFRC  --   --  0.74*  CREATININE 0.31*  --  0.41*  TROPONINI 0.23*  --  0.22*    Estimated Creatinine Clearance: 36.6 mL/min (A) (by C-G formula based on SCr of 0.41 mg/dL (L)).   Medications:  Infusions:  . cefTRIAXone (ROCEPHIN)  IV    . heparin 1,000 Units/hr (04/14/18 [id number])    Assessment: Patient is a 84yo female with chronic PE. Currently on Heparin infusion. Pharmacy consulted for transition to apixaban.   Plan:  Will order Apixaban 2.5mg  PO bid to begin at 22:00 tonight and will stop Heparin infusion at this time as well.  Clovia CuffLisa Richar Dunklee, PharmD, BCPS 04/14/2018 2:03 PM

## 2018-04-14 NOTE — Progress Notes (Signed)
Patient ID: Kristin Graham, female   DOB: [birth year], 84 y.o.   MRN: [id number]  [facility] PROGRESS NOTE  Kristin Graham NWG:[id number] DOB: [birth year] DOA: 04/13/2018 PCP: [name], [name], MD  HPI/Subjective: Patient brought in by family for shortness of breath.  Recent hospitalization for distal left femur.  Family did not want any surgical management at that time.  The goal is to send the patient home.  She will have her 89th birthday in a few weeks.  Patient having some issues with swallowing.  Had decreased appetite and weight loss.  Objective: Vitals:   04/13/18 2200 04/14/18 0800  BP: 106/76 117/68  Pulse: 89 96  Resp: 19 18  Temp:  98 F (36.7 C)  SpO2: 100% 97%   No intake or output data in the 24 hours ending 04/14/18 1353 Filed Weights   04/14/18 0218  Weight: 69.1 kg (152 lb 4.8 oz)    ROS: Review of Systems  Constitutional: Negative for chills and fever.  Eyes: Negative for blurred vision.  Respiratory: Positive for cough and shortness of breath.   Cardiovascular: Negative for chest pain.  Gastrointestinal: Negative for abdominal pain, constipation, diarrhea, nausea and vomiting.  Genitourinary: Negative for dysuria.  Musculoskeletal: Positive for joint pain.  Neurological: Negative for dizziness and headaches.   Exam: Physical Exam  HENT:  Nose: No mucosal edema.  Mouth/Throat: No oropharyngeal exudate or posterior oropharyngeal edema.  Eyes: Pupils are equal, round, and reactive to light. Conjunctivae and lids are normal.  Neck: No JVD present. Carotid bruit is not present. No edema present. No thyroid mass and no thyromegaly present.  Cardiovascular: S1 normal and S2 normal. Exam reveals no gallop.  No murmur heard. Pulses:      Dorsalis pedis pulses are 2+ on the right side, and 2+ on the left side.  Respiratory: No respiratory distress. She has decreased breath sounds in the right lower field and the left lower field. She has no wheezes. She has  no rhonchi. She has no rales.  GI: Soft. Bowel sounds are normal. There is no tenderness.  Musculoskeletal:       Right ankle: She exhibits no swelling.       Left ankle: She exhibits no swelling.  Lymphadenopathy:    She has no cervical adenopathy.  Neurological: She is alert.  Skin: Skin is warm. Nails show no clubbing.  As per nurse stage II decubitus on buttock  Psychiatric: She has a normal mood and affect.      Data Reviewed: Basic Metabolic Panel: Recent Labs  Lab 04/10/18 0046 04/13/18 1820 04/14/18 0543  NA 142 137 138  K 3.6 3.7 3.0*  CL 102 96* 97*  CO2 32 31 30  GLUCOSE 140* 114* 178*  BUN 11 10 14   CREATININE 0.31* 0.31* 0.41*  CALCIUM 8.8* 8.6* 8.3*   Liver Function Tests: Recent Labs  Lab 04/10/18 0046 04/13/18 1820  AST 21 19  ALT 15 13  ALKPHOS 55 58  BILITOT 1.0 1.7*  PROT 6.3* 6.3*  ALBUMIN 3.1* 2.9*   CBC: Recent Labs  Lab 04/10/18 0046 04/13/18 1820 04/14/18 0543  WBC 14.0* 10.8 11.4*  NEUTROABS  --  7.6*  --   HGB 14.9 13.0 12.6  HCT 45.9 39.0 36.9  MCV 91.6 91.0 90.3  PLT 245 255 250   Cardiac Enzymes: Recent Labs  Lab 04/13/18 1820 04/14/18 0543  TROPONINI 0.23* 0.22*   BNP (last 3 results) Recent Labs    04/13/18  1820  BNP 69.0    CBG: Recent Labs  Lab 04/14/18 0824 04/14/18 1136  GLUCAP 119* 218*    Recent Results (from the past 240 hour(s))  Gastrointestinal Panel by PCR , Stool     Status: None   Collection Time: 04/09/18 11:35 PM  Result Value Ref Range Status   Campylobacter species NOT DETECTED NOT DETECTED Final   Plesimonas shigelloides NOT DETECTED NOT DETECTED Final   Salmonella species NOT DETECTED NOT DETECTED Final   Yersinia enterocolitica NOT DETECTED NOT DETECTED Final   Vibrio species NOT DETECTED NOT DETECTED Final   Vibrio cholerae NOT DETECTED NOT DETECTED Final   Enteroaggregative E coli (EAEC) NOT DETECTED NOT DETECTED Final   Enteropathogenic E coli (EPEC) NOT DETECTED NOT DETECTED  Final   Enterotoxigenic E coli (ETEC) NOT DETECTED NOT DETECTED Final   Shiga like toxin producing E coli (STEC) NOT DETECTED NOT DETECTED Final   Shigella/Enteroinvasive E coli (EIEC) NOT DETECTED NOT DETECTED Final   Cryptosporidium NOT DETECTED NOT DETECTED Final   Cyclospora cayetanensis NOT DETECTED NOT DETECTED Final   Entamoeba histolytica NOT DETECTED NOT DETECTED Final   Giardia lamblia NOT DETECTED NOT DETECTED Final   Adenovirus F40/41 NOT DETECTED NOT DETECTED Final   Astrovirus NOT DETECTED NOT DETECTED Final   Norovirus GI/GII NOT DETECTED NOT DETECTED Final   Rotavirus A NOT DETECTED NOT DETECTED Final   Sapovirus (I, II, IV, and V) NOT DETECTED NOT DETECTED Final    Comment: Performed at [facility], [address] [place], [place] [address]     Studies: Dg Chest 2 View  Result Date: 04/13/2018 CLINICAL DATA:  Dyspnea, left femur fracture EXAM: CHEST - 2 VIEW COMPARISON:  12/29/2017 FINDINGS: There are small bilateral pleural effusions. Bibasilar airspace disease. Stable borderline cardiomegaly. Aortic atherosclerosis. No pneumothorax. Calcified nodule at the left lung apex. Multiple old left-sided rib fractures. IMPRESSION: 1. Small pleural effusions with bibasilar atelectasis or pneumonia. 2. Stable borderline to mild cardiomegaly. Electronically Signed   By: Jasmine Pang M.D.   On: 04/13/2018 17:59   Ct Angio Chest Pe W And/or Wo Contrast  Result Date: 04/13/2018 CLINICAL DATA:  Left femur fracture difficulty breathing EXAM: CT ANGIOGRAPHY CHEST WITH CONTRAST TECHNIQUE: Multidetector CT imaging of the chest was performed using the standard protocol during bolus administration of intravenous contrast. Multiplanar CT image reconstructions and MIPs were obtained to evaluate the vascular anatomy. CONTRAST:  75mL OMNIPAQUE IOHEXOL 350 MG/ML SOLN COMPARISON:  Chest x-ray 04/13/2018, CT abdomen 12/28/2017 PET CT 04/07/2010 FINDINGS: Cardiovascular: Satisfactory  opacification of the pulmonary arteries to the segmental level. Eccentric thrombus within the right inter lobar pulmonary artery and distal main right pulmonary artery. Web like defects within the right inter lobar pulmonary artery consistent with sequela of chronic PE. More acute nonocclusive filling defects within subsegmental right lower lobe pulmonary branch vessels, for example series 5, image number 176 and series 5, image number 160. No definite acute filling defects on the left. Heterogeneous density within the aorta is felt secondary to admixing of contrast. Ectatic ascending aorta without aneurysm. Moderate aortic atherosclerosis. Coronary vascular calcification. Borderline heart size. No significant pericardial effusion. Mediastinum/Nodes: Midline trachea. Heterogeneous thyroid. 13 mm hypodensity inferior to the left bronchus, no change. Esophagus within normal limits. Lungs/Pleura: Small right-sided pleural effusion. Passive atelectasis at the right base. Nodular densities within the apices of both lungs, no change since 2011 and presumably due to scarring. New 10 mm right middle lobe pulmonary nodule, series 6, image  number 71. Upper Abdomen: No acute abnormality. Musculoskeletal: Ankylosis of the spine. No acute osseous abnormality. Sternum is intact. Similar appearance of bulky soft tissue and bony changes at the bilateral sternoclavicular joints. Review of the MIP images confirms the above findings. IMPRESSION: 1. Thickening at the distal right pulmonary artery and inter lobar pulmonary artery with several web like defects, suspect for chronic PE. More acute appearing small emboli within subsegmental right lower lobe pulmonary branch vessels. 2. Small right pleural effusion and atelectasis at the right base 3. 10 cm right middle lobe pulmonary nodule. Consider one of the following in 3 months for both low-risk and high-risk individuals: (a) repeat chest CT, (b) follow-up PET-CT, or (c) tissue  sampling. This recommendation follows the consensus statement: Guidelines for Management of Incidental Pulmonary Nodules Detected on CT Images: From the Fleischner Society 2017; Radiology 2017; 284:228-243. Critical Value/emergent results were called by telephone at the time of interpretation on 04/13/2018 at 8:24 pm to Dr. Daryel November , who verbally acknowledged these results. Aortic Atherosclerosis (ICD10-I70.0). Electronically Signed   By: Jasmine Pang M.D.   On: 04/13/2018 20:24    Scheduled Meds: . acetaminophen  650 mg Oral Q6H  . acidophilus  1 capsule Oral Daily  . atorvastatin  10 mg Oral QHS  . B-complex with vitamin C  1 tablet Oral Daily  . calcium-vitamin D  1 tablet Oral Q breakfast  . docusate sodium  100 mg Oral BID  . furosemide  40 mg Oral Daily  . latanoprost  1 drop Both Eyes QHS  . magnesium oxide  200 mg Oral QHS  .  morphine injection  2 mg Intravenous Once  . multivitamin with minerals  1 tablet Oral Daily  . vitamin B-12  100 mcg Oral Daily  . ascorbic acid  1,000 mg Oral BID  . vitamin E  400 Units Oral QPM  . zinc sulfate  220 mg Oral QPM   Continuous Infusions: . cefTRIAXone (ROCEPHIN)  IV    . heparin 1,000 Units/hr (04/14/18 0853)    Assessment/Plan:  1. Acute on chronic pulmonary embolism.  Patient on heparin drip.  Switch over to Eliquis this evening.  Echocardiogram and ultrasound of the lower extremities currently pending results. 2. Elevated troponin secondary to demand ischemia from pulmonary embolism 3. Acute cystitis on Rocephin.  Follow-up urine culture results. 4. Recent fracture left femur.  They did not want surgery at that time. 5. Stage II decubitus ulcer on buttock 6. Chronic diarrhea add acidophilus 7. Hyperlipidemia on atorvastatin 8. Palliative care to follow at home. 9. Appreciate speech therapy consultation.  Code Status:     Code Status Orders  (From admission, onward)        Start     Ordered   04/13/18 2344  Do  not attempt resuscitation (DNR)  Continuous    Question Answer Comment  In the event of cardiac or respiratory ARREST Do not call a "code blue"   In the event of cardiac or respiratory ARREST Do not perform Intubation, CPR, defibrillation or ACLS   In the event of cardiac or respiratory ARREST Use medication by any route, position, wound care, and other measures to relive pain and suffering. May use oxygen, suction and manual treatment of airway obstruction as needed for comfort.   Comments rN may pronounce      04/13/18 2343    Code Status History    Date Active Date Inactive Code Status Order ID Comments User Context  04/10/2018 1249 04/12/2018 1715 DNR [id number]  Ramonita Lab, MD Inpatient   04/10/2018 0309 04/10/2018 1249 Full Code [id number]  Arnaldo Natal, MD ED   12/28/2017 0442 12/31/2017 1521 Full Code [id number]  Arnaldo Natal, MD Inpatient   07/08/2017 0352 07/10/2017 2056 Full Code [id number]  Gery Pray, MD ED    Advance Directive Documentation     Most Recent Value  Type of Advance Directive  Healthcare Power of Attorney, Out of facility DNR (pink MOST or yellow form)  Pre-existing out of facility DNR order (yellow form or pink MOST form)  Yellow form placed in chart (order not valid for inpatient use)  "MOST" Form in Place?  -     Family Communication: Spoke with daughter at the bedside at length Disposition Plan: Potentially home tomorrow  Time spent: 35 minutes  Kashton Mcartor [employer]

## 2018-04-14 NOTE — Consult Note (Signed)
ANTICOAGULATION CONSULT NOTE - Initial Consult  Pharmacy Consult for Heparin Drip  Indication: pulmonary embolus  Allergies  Allergen Reactions  . Bee Venom Swelling  . Chocolate   . Nsaids   . Vicodin [Hydrocodone-Acetaminophen]     Patient Measurements: Height: 5\' 4"  (162.6 cm) Weight: 152 lb 4.8 oz (69.1 kg) IBW/kg (Calculated) : 54.7 Heparin Dosing Weight: 70kg  Vital Signs: Temp: 97.5 F (36.4 C) (07/06 0353) Temp Source: Oral (07/06 0353) BP: 119/70 (07/06 0353) Pulse Rate: 106 (07/06 0353)  Labs: Recent Labs    04/13/18 1820 04/13/18 2107 04/14/18 0543  HGB 13.0  --  12.6  HCT 39.0  --  36.9  PLT 255  --  250  APTT  --  31  --   LABPROT  --  12.9  --   INR  --  0.98  --   HEPARINUNFRC  --   --  0.74*  CREATININE 0.31*  --   --   TROPONINI 0.23*  --   --     Estimated Creatinine Clearance: 36.6 mL/min (A) (by C-G formula based on SCr of 0.31 mg/dL (L)).   Medical History: Past Medical History:  Diagnosis Date  . Bilateral carpal tunnel syndrome    S/P surgery  . Depression    unspecified  . Foot fracture, right   . Glaucoma   . Left leg weakness    Progressive, evaluated by Ortho. LS spine MRI with enhancing mass involving L2 vertebra. oncology evaluation with Dr. Doylene Canninghoksi 6/11; bone scan negative; PET scan negative. 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 Neurosurgery consultation arranged by Oncology.  . Left shoulder tendinitis    with partial rotator cuff tear evaluated by Dr. Erin SonsHarold Kernodle  . Leg edema    likely venous insufficiency. Declines compression hose. Declines physical therapy. Echo 3/10 with normal heart function, mild MR.  . Osteoarthritis   . Osteoporosis, post-menopausal   . Stroke (HCC)    History of CVA versus TIA, 1996, with reported episodes of TIA since then. Left pontine CVA by MRI 12/14.  [name]. Urinary incontinence   . Vision loss of left eye    followed by Dr. Fransico MichaelBrennan. Possibly  glaucoma-induced versus ischemic neuropathy from vascular disease.   Assessment: Pharmacy consulted for heparin dosing for 84yo female admitted with PE. Patient was recently admitted to hospital for femur fracture.   Goal of Therapy:  Heparin level 0.3-0.7 units/ml Monitor platelets by anticoagulation protocol: Yes   Plan:  07/06 @ 0600 HL 0.74 supratherapeutic. Will decrease rate to 1000 units/hr and will recheck anti-Xa @ 1400. CBC stable.  Thomasene Rippleavid  Daman Steffenhagen, PharmD, BCPS Clinical Pharmacist 04/14/2018 6:15 AM

## 2018-04-14 NOTE — Clinical Social Work Note (Signed)
CSW received consult for transportation needs. This patient is followed at home by hospice. Her hospice provider can assist with transportation needs. CSW is signing off. Please consult should needs change.  Argentina PonderKaren Martha Onika Gudiel, MSW, Theresia MajorsLCSWA [phone]

## 2018-04-14 NOTE — Care Management Obs Status (Signed)
MEDICARE OBSERVATION STATUS NOTIFICATION   Patient Details  Name: Kristin Graham J Rigel MRN: [id number] Date of Birth: [birth year]   Medicare Observation Status Notification Given:  Yes    Raia Amico A Sidney Kann, RN 04/14/2018, 4:05 PM

## 2018-04-14 NOTE — Evaluation (Signed)
Clinical/Bedside Swallow Evaluation Patient Details  Name: Kristin Graham MRN: [id number] Date of Birth: 1916/08/12  Today's Date: 04/14/2018 Time: SLP Start Time (ACUTE ONLY): 1000 SLP Stop Time (ACUTE ONLY): 1100 SLP Time Calculation (min) (ACUTE ONLY): 60 min  Past Medical History:  Past Medical History:  Diagnosis Date  . Bilateral carpal tunnel syndrome    S/P surgery  . Depression    unspecified  . Foot fracture, right   . Glaucoma   . Left leg weakness    Progressive, evaluated by Ortho. LS spine MRI with enhancing mass involving L2 vertebra. oncology evaluation with Dr. Doylene Canning 6/11; bone scan negative; PET scan negative. 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 Neurosurgery consultation arranged by Oncology.  . Left shoulder tendinitis    with partial rotator cuff tear evaluated by Dr. Erin Sons  . Leg edema    likely venous insufficiency. Declines compression hose. Declines physical therapy. Echo 3/10 with normal heart function, mild MR.  . Osteoarthritis   . Osteoporosis, post-menopausal   . Stroke (HCC)    History of CVA versus TIA, 1996, with reported episodes of TIA since then. Left pontine CVA by MRI 12/14.  [name] Urinary incontinence   . Vision loss of left eye    followed by Dr. Fransico Michael. Possibly glaucoma-induced versus ischemic neuropathy from vascular disease.   Past Surgical History:  Past Surgical History:  Procedure Laterality Date  . CARPAL TUNNEL RELEASE  2003  . CATARACT EXTRACTION, BILATERAL  2000  . CHOLECYSTECTOMY    . JOINT REPLACEMENT  07/2005   left total knee, Dr. Erin Sons   HPI:  Pt is a 84 y.o. female with a known history of Dementia, CVA, urinary incontinence, vision loss and most recently status post left hip fracture, which was not repaired, per patient's decision. Pt is uncomfortable in bed d/t sore bottom; perseverates on this. Patient was brought to emergency room for shortness of breath.  CT Angio of the chest shows acute and chronic pulmonary emboli. UA is positive for UTI. Pt is NOT on any O2 support this morning; respiratory status appears calm and comfortable w/ no c/o SOB by pt/Dtr. Pt does require verbal cues for follow through w/ tasks; support w/ tasks but could hold her own Cup independently for drinking.    Assessment / Plan / Recommendation Clinical Impression  Pt appears to present w/ adequate oropharyngeal phase swallow function w/ no immediate, overt s/s of aspiration noted - pt appears at reduced risk for aspiration when following general aspiration precautions including sitting fully upright w/ any oral intake, monitoring straw use(large straws not recommended). Also recommend using a puree for swallowing Pills for safer, easier swallowing. Also recommend modifying the food consistencies to make it easier and less effortful for oral intake/exertion of such and support during the eating/drinking. Pt consumed po trials of thin liquids and softened solids w/ no overt s/s of aspiration noted; no reported s/s of aspiration noted by Dtr/NSG earlier as well. Vocal quality remained clear post swallowing; no decline in respiratory status during/post trials. Pt held Cup to feed self w/ only min monitoring needed - this makes her safer w/ task. Oral phase appeared grossly wfl for bolus management and oral clearing w/ no anterior leakage indicating adequate strength/ROM of lingual/labial movements. However, noted min increased time and attention needed w/ the trials of increased texture; giving time for oral clearing and alternating foods/liquids appeared to aid overall oral clearing w/ the more solid  foods and aid w/ pt's level of exertion w/ task. Pt fed food trials. Recommend a more modified diet for easier intake and self-feeding overall MINCED foods(dysphagia level 2); thin liquids w/ monitoring of ANY use of straws w/ Cup only if coughing noted. Recommend general aspiration precautions;  use of a Puree for swallowing Pills. Support during meals to lessen fatigue w/ task of eating; use of a drink supplement as needed per Dietician recommendation. ST services will be available for further education to Dtr while admitted; handouts given.  SLP Visit Diagnosis: Dysphagia, oral phase (R13.11)    Aspiration Risk  (reduced following general aspiration precautions; support)    Diet Recommendation  Dysphagia level 2 (MINCED foods w/ gravies); thin liquids via Cup and monitoring of any straw use; general aspiration precautions; support w/ feeding at meals. Nutritional supplements as needed per Dietician.   Medication Administration: Whole meds with puree(or Crushed as needed for safer, easier swallowing)    Other  Recommendations Recommended Consults: (Dietician f/u; Palliative care/Hospice education on adv age) Oral Care Recommendations: Oral care BID;Patient independent with oral care;Staff/trained caregiver to provide oral care Other Recommendations: (n/a)   Follow up Recommendations None(education and handouts given to Dtr)      Frequency and Duration (n/a)  (n/a)       Prognosis Prognosis for Safe Diet Advancement: Fair(-Good) Barriers to Reach Goals: Cognitive deficits;Severity of deficits;Behavior(at baseline; advanced age)      Swallow Study   General Date of Onset: 04/13/18 HPI: Pt is a 84 y.o. female with a known history of Dementia, CVA, urinary incontinence, vision loss and most recently status post left hip fracture, which was not repaired, per patient's decision. Pt is uncomfortable in bed d/t sore bottom; perseverates on this. Patient was brought to emergency room for shortness of breath. CT Angio of the chest shows acute and chronic pulmonary emboli. UA is positive for UTI. Pt is NOT on any O2 support this morning; respiratory status appears calm and comfortable w/ no c/o SOB by pt/Dtr. Pt does require verbal cues for follow through w/ tasks; support w/ tasks but  could hold her own Cup independently for drinking.  Type of Study: Bedside Swallow Evaluation Previous Swallow Assessment: none reported Diet Prior to this Study: Regular;Thin liquids(Dtr had been cutting foods) Temperature Spikes Noted: No(wbc mildly elevated; Troponins elevated) Respiratory Status: Room air(this morning) History of Recent Intubation: No Behavior/Cognition: Alert;Cooperative;Pleasant mood;Confused;Distractible;Requires cueing(baseline Dementia) Oral Cavity Assessment: Within Functional Limits;Dry(min) Oral Care Completed by SLP: Recent completion by staff Oral Cavity - Dentition: Adequate natural dentition Vision: Functional for self-feeding Self-Feeding Abilities: Able to feed self;Needs assist;Needs set up;Total assist(left vision deficits) Patient Positioning: Upright in bed(supported by pillows) Baseline Vocal Quality: Normal Volitional Cough: (Fair) Volitional Swallow: Able to elicit    Oral/Motor/Sensory Function Overall Oral Motor/Sensory Function: Within functional limits   Ice Chips Ice chips: Within functional limits Presentation: Spoon(fed; 2 trials) Other Comments: did not chew them   Thin Liquid Thin Liquid: Within functional limits Presentation: Cup;Self Fed(supported as needed; ~2-3 ozs total)    Nectar Thick Nectar Thick Liquid: Not tested   Honey Thick Honey Thick Liquid: Not tested   Puree Puree: Within functional limits Presentation: Spoon(fed; 8 trials)   Solid   GO   Solid: Impaired(mech soft foods broken down more) Presentation: Spoon(fed; 3 trials) Oral Phase Impairments: Poor awareness of bolus;Impaired mastication(min increased time/attention) Oral Phase Functional Implications: Impaired mastication;Prolonged oral transit(min increased time/attention overall) Pharyngeal Phase Impairments: (none) Other Comments: moistening foods more  aided; alternating foods/liquids        Jerilynn Som, MS,  CCC-SLP Kristin Graham 04/14/2018,11:32 AM

## 2018-04-14 NOTE — Progress Notes (Signed)
*  PRELIMINARY RESULTS* Echocardiogram 2D Echocardiogram has been performed.  Kyliah Deanda S Adelis Docter 04/14/2018, 2:28 PM 

## 2018-04-14 NOTE — Care Management Note (Signed)
Case Management Note  Patient Details  Name: Ruthann Cancerlease J Holstad MRN: [id number] Date of Birth: [birth year]  Subjective/Objective:    Patient admitted to [facility] under observation status for pulmonary emboli. RNCM consulted on patient to provide MOON letter and complete assessment.' Patient currently lives at home with her daughter and Jamesetta GeraldsOA [name] [phone]. Patient is followed by hospice/palliative services with [facility]. Patient was with advanced home care prior to this. Patient is bed bound. Multiple DME available in home including a hospital bed and hoyer lift. Daughter tearful during conversation and hopes her mother will survive until her 89th birthday. Does not want hospice until then since she is concerned that "they will help her die" but does agree to continue with palliative services until then and possibly progress to hospice care at home.               Action/Plan RNCM to continue to follow for any needs and discharge planning    Expected Discharge Date:                  Expected Discharge Plan:     In-House Referral:     Discharge planning Services     Post Acute Care Choice:    Choice offered to:     DME Arranged:    DME Agency:     HH Arranged:    HH Agency:     Status of Service:     If discussed at [employer] Meetings, dates discussed:    Additional Comments:  Virgel ManifoldJosh A Breane Grunwald, RN 04/14/2018, 4:25 PM

## 2018-04-14 NOTE — Progress Notes (Signed)
Pt arrived from ED with daughter at bedside. Daughter was initially confrontational about her mothers care. Daughter is POA. Pt remains on a heparin drip at 10 ml/h. Pt given tylenol for 6/10 pain. Pt became confused and aggressive this a.m. Pt  thinks this nurse is "prowling around her room looking through her suitcase" and threatened to report this nurse to the authorities. Pt has no suitcase, daughter at beside and aware of behavior. Pt swallowed medication crushed in applesauce with no problems swallowing, daughter gave her a  Milkshake and pt had some difficulty.  Daughter would like a speech therapy consult.

## 2018-04-14 NOTE — Progress Notes (Signed)
Patient received scheduled tylenol with night meds due to sleeping earlier - lethargic in response, but responds appropriately to verbal stimuli

## 2018-04-15 LAB — BASIC METABOLIC PANEL
ANION GAP: 8 (ref 5–15)
BUN: 12 mg/dL (ref 8–23)
CO2: 32 mmol/L (ref 22–32)
Calcium: 8.3 mg/dL — ABNORMAL LOW (ref 8.9–10.3)
Chloride: 98 mmol/L (ref 98–111)
Creatinine, Ser: <0.3 mg/dL — ABNORMAL LOW (ref 0.44–1.00)
GLUCOSE: 124 mg/dL — AB (ref 70–99)
Potassium: 3.3 mmol/L — ABNORMAL LOW (ref 3.5–5.1)
SODIUM: 138 mmol/L (ref 135–145)

## 2018-04-15 LAB — GLUCOSE, CAPILLARY: GLUCOSE-CAPILLARY: 118 mg/dL — AB (ref 70–99)

## 2018-04-15 LAB — HEMOGLOBIN: Hemoglobin: 11.9 g/dL — ABNORMAL LOW (ref 12.0–16.0)

## 2018-04-15 MED ORDER — FUROSEMIDE 40 MG PO TABS: 40.0000 mg | ORAL_TABLET | Freq: Every day | ORAL | 0 refills | Status: DC

## 2018-04-15 MED ORDER — METOPROLOL SUCCINATE ER 25 MG PO TB24
25.0000 mg | ORAL_TABLET | Freq: Every day | ORAL | Status: DC
  Administered 2018-04-15: 25 mg via ORAL
  Filled 2018-04-15: qty 1

## 2018-04-15 MED ORDER — POTASSIUM CHLORIDE CRYS ER 20 MEQ PO TBCR
40.0000 meq | EXTENDED_RELEASE_TABLET | Freq: Once | ORAL | Status: AC
  Administered 2018-04-15: 40 meq via ORAL
  Filled 2018-04-15: qty 2

## 2018-04-15 MED ORDER — MORPHINE SULFATE (CONCENTRATE) 10 MG/0.5ML PO SOLN: 5.0000 mg | ORAL | 0 refills | Status: AC | PRN

## 2018-04-15 MED ORDER — APIXABAN 5 MG PO TABS: ORAL_TABLET | ORAL | 0 refills | Status: DC

## 2018-04-15 MED ORDER — MORPHINE SULFATE (CONCENTRATE) 10 MG/0.5ML PO SOLN: 5.0000 mg | ORAL | Status: DC | PRN

## 2018-04-15 MED ORDER — APIXABAN 5 MG PO TABS: ORAL_TABLET | ORAL | 0 refills | Status: AC

## 2018-04-15 MED ORDER — METOPROLOL SUCCINATE ER 25 MG PO TB24: 25.0000 mg | ORAL_TABLET | Freq: Every day | ORAL | 0 refills | Status: AC

## 2018-04-15 MED ORDER — APIXABAN 5 MG PO TABS: 5.0000 mg | ORAL_TABLET | Freq: Two times a day (BID) | ORAL | Status: DC

## 2018-04-15 MED ORDER — APIXABAN 5 MG PO TABS
10.0000 mg | ORAL_TABLET | Freq: Two times a day (BID) | ORAL | Status: DC
  Administered 2018-04-15: 10 mg via ORAL
  Filled 2018-04-15: qty 2

## 2018-04-15 MED ORDER — FUROSEMIDE 40 MG PO TABS: 40.0000 mg | ORAL_TABLET | Freq: Every day | ORAL | 0 refills | Status: AC

## 2018-04-15 NOTE — Progress Notes (Signed)
Kristin Graham to be D/C'd Home per MD order.  Discussed prescriptions and follow up appointments with the patient. Prescriptions given to daughter, medication list explained in detail.  Daughter verbalized understanding.  Allergies as of 04/15/2018      Reactions   Bee Venom Swelling   Chocolate    Nsaids    Vicodin [hydrocodone-acetaminophen]       Medication List    STOP taking these medications   aspirin 325 MG tablet   traMADol 50 MG tablet Commonly known as:  ULTRAM     TAKE these medications   acetaminophen 500 MG tablet Commonly known as:  TYLENOL Take 250 mg by mouth every 6 (six) hours as needed for mild pain or moderate pain. 1/2 tablet per home routine   apixaban 5 MG Tabs tablet Commonly known as:  ELIQUIS Two tabs twice a day for 7 days then one tablet twice a day afterwards   ascorbic acid 1000 MG tablet Commonly known as:  VITAMIN C Take 1,000 mg by mouth 2 (two) times daily.   atorvastatin 10 MG tablet Commonly known as:  LIPITOR Take 10 mg by mouth at bedtime.   bimatoprost 0.03 % ophthalmic solution Commonly known as:  LUMIGAN Place 1 drop into both eyes at bedtime.   calcium-vitamin D 500-200 MG-UNIT tablet Commonly known as:  OSCAL WITH D Take 1 tablet by mouth daily with breakfast.   camphor-menthol lotion Commonly known as:  SARNA Apply 1 application topically daily as needed for itching. Apply to affected area   CENTRUM SILVER tablet Take 1 tablet by mouth daily.   CRANBERRY PLUS VITAMIN C 4200-20-3 MG-MG-UNIT Caps Generic drug:  Cranberry-Vitamin C-Vitamin E Take 2 capsules by mouth daily.   docusate sodium 100 MG capsule Commonly known as:  COLACE Take 100 mg by mouth daily as needed for mild constipation.   dorzolamide-timolol 22.3-6.8 MG/ML ophthalmic solution Commonly known as:  COSOPT Place 1 drop into both eyes 2 (two) times daily.   furosemide 40 MG tablet Commonly known as:  LASIX Take 1 tablet (40 mg total) by mouth  daily. What changed:    medication strength  how much to take   magnesium oxide 400 MG tablet Commonly known as:  MAG-OX Take 100 mg by mouth at bedtime.   metoprolol succinate 25 MG 24 hr tablet Commonly known as:  TOPROL-XL Take 1 tablet (25 mg total) by mouth daily.   morphine CONCENTRATE 10 MG/0.5ML Soln concentrated solution Take 0.25 mLs (5 mg total) by mouth every 2 (two) hours as needed for severe pain.   RISA-BID PROBIOTIC Tabs Take 1 tablet by mouth daily.   vitamin B-12 100 MCG tablet Commonly known as:  CYANOCOBALAMIN Take 100 mcg by mouth daily.   Vitamin E 400 units Tabs Take 1 tablet by mouth every evening.   Vitamin-B Complex Tabs Take 1 tablet by mouth daily with breakfast.   ZINC-220 PO Take 1 tablet by mouth every evening.       Vitals:   04/15/18 0941 04/15/18 1126  BP:  117/63  Pulse:  85  Resp:  18  Temp:    SpO2: 94% 95%    Skin clean, dry and intact without evidence of skin break down, no evidence of skin tears noted. IV catheter discontinued intact. Site without signs and symptoms of complications. Dressing and pressure applied. Pt denies pain at this time. No complaints noted.  An After Visit Summary was printed and given to the patients daughter. Ems  called for non emergent transport.  Kristin Graham Kristin Graham Kristin Graham

## 2018-04-15 NOTE — Progress Notes (Signed)
ANTICOAGULATION CONSULT NOTE - Follow Up Consult  Pharmacy Consult for Apixaban dosing Indication: pulmonary embolus  Allergies  Allergen Reactions  . Bee Venom Swelling  . Chocolate   . Nsaids   . Vicodin [Hydrocodone-Acetaminophen]     Patient Measurements: Height: 5\' 4"  (162.6 cm) Weight: (attempted to do daily weight, pts family member refused.) IBW/kg (Calculated) : 54.7 Heparin Dosing Weight:    Vital Signs: Temp: 98.4 F (36.9 C) (07/07 0801) Temp Source: Oral (07/07 0801) BP: 118/66 (07/07 0801) Pulse Rate: 84 (07/07 0801)  Labs: Recent Labs    04/13/18 1820 04/13/18 2107 04/14/18 0543 04/14/18 1401 04/15/18 0420  HGB 13.0  --  12.6  --  11.9*  HCT 39.0  --  36.9  --   --   PLT 255  --  250  --   --   APTT  --  31  --   --   --   LABPROT  --  12.9  --   --   --   INR  --  0.98  --   --   --   HEPARINUNFRC  --   --  0.74* 0.62  --   CREATININE 0.31*  --  0.41*  --  <0.30*  TROPONINI 0.23*  --  0.22*  --   --     CrCl cannot be calculated (This lab value cannot be used to calculate CrCl because it is not a number: <0.30).   Medications:  Infusions:  . cefTRIAXone (ROCEPHIN)  IV Stopped (04/15/18 0554)    Assessment: Patient is a 84yo female with chronic PE. Currently on Heparin infusion. Pharmacy consulted for transition to apixaban.   Plan:  As per conversation with MD, will order Apixaban 10mg  PO bid x 7 days to begin this morning, followed by 5mg  PO BID.   Stormy CardKatsoudas,Michella Detjen K, [facility]ura[facility] OrleansRPH 04/15/2018 9:02 AM

## 2018-04-15 NOTE — Discharge Summary (Signed)
[facility] - [place] at [facility]   PATIENT NAME: Kristin Graham    MR#:  [id number]  DATE OF BIRTH:  Jan 29, 1916  DATE OF ADMISSION:  04/13/2018 ADMITTING PHYSICIAN: Cammy Copa, MD  DATE OF DISCHARGE: 04/15/2018  PRIMARY CARE PHYSICIAN: Lynnea Ferrier, MD    ADMISSION DIAGNOSIS:  Shortness of breath [R06.02] Elevated troponin I level [R74.8] Other acute pulmonary embolism without acute cor pulmonale (HCC) [I26.99]  DISCHARGE DIAGNOSIS:  Active Problems:   Pulmonary emboli (HCC)   SECONDARY DIAGNOSIS:   Past Medical History:  Diagnosis Date  . Bilateral carpal tunnel syndrome    S/P surgery  . Depression    unspecified  . Foot fracture, right   . Glaucoma   . Left leg weakness    Progressive, evaluated by Ortho. LS spine MRI with enhancing mass involving L2 vertebra. oncology evaluation with Dr. Doylene Canning 6/11; bone scan negative; PET scan negative. 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 Neurosurgery consultation arranged by Oncology.  . Left shoulder tendinitis    with partial rotator cuff tear evaluated by Dr. Erin Sons  . Leg edema    likely venous insufficiency. Declines compression hose. Declines physical therapy. Echo 3/10 with normal heart function, mild MR.  . Osteoarthritis   . Osteoporosis, post-menopausal   . Stroke (HCC)    History of CVA versus TIA, 1996, with reported episodes of TIA since then. Left pontine CVA by MRI 12/14.  [name] Urinary incontinence   . Vision loss of left eye    followed by Dr. Fransico Michael. Possibly glaucoma-induced versus ischemic neuropathy from vascular disease.    HOSPITAL COURSE:   1.  Acute on chronic pulmonary embolism.  Patient switch from heparin drip over to Eliquis orally.  Ultrasound of the lower extremity shows chronic DVT.  Echocardiogram showed normal EF with severe pulmonary hypertension.  Long discussion with daughter at the bedside.  Eliquis will be  needed  for 6 months at least. 2.  Elevated troponin secondary to demand ischemia from pulmonary embolism 3.  Acute cystitis on Rocephin.  Unfortunately no urine culture was sent from the ER.  Received 3 days of antibiotics.  4.  Recent  fracture left femur.  Family did not want surgery at that time.  Mortality is high with fracturing the weightbearing bone and not repairing it.  Roxanol prescribed. 5.  15 beat run of SVT.  Prescribed Toprol-XL.  Likely secondary to pulmonary embolism. 6.  Hyperlipidemia unspecified on atorvastatin 7.  History of edema on Lasix 8.  History of stroke.  Stop aspirin.  Patient on Eliquis. 9.  Home health set up.  Also set up for palliative care at house.  Recommend conversion to hospice if the patient declines.  Daughter states the patient has not been eating and has been losing weight over the past month.  This is also a poor prognostic sign. 10.  Pulmonary nodule seen on CT scan of the chest.  With her age and prognosis I would defer further work-up at this time.   DISCHARGE CONDITIONS:   Fair  CONSULTS OBTAINED:  None  DRUG ALLERGIES:   Allergies  Allergen Reactions  . Bee Venom Swelling  . Chocolate   . Nsaids   . Vicodin [Hydrocodone-Acetaminophen]     DISCHARGE MEDICATIONS:   Allergies as of 04/15/2018      Reactions   Bee Venom Swelling   Chocolate    Nsaids    Vicodin [hydrocodone-acetaminophen]  Medication List    STOP taking these medications   aspirin 325 MG tablet   traMADol 50 MG tablet Commonly known as:  ULTRAM     TAKE these medications   acetaminophen 500 MG tablet Commonly known as:  TYLENOL Take 250 mg by mouth every 6 (six) hours as needed for mild pain or moderate pain. 1/2 tablet per home routine   apixaban 5 MG Tabs tablet Commonly known as:  ELIQUIS Two tabs twice a day for 7 days then one tablet twice a day afterwards   ascorbic acid 1000 MG tablet Commonly known as:  VITAMIN C Take 1,000 mg by mouth 2 (two)  times daily.   atorvastatin 10 MG tablet Commonly known as:  LIPITOR Take 10 mg by mouth at bedtime.   bimatoprost 0.03 % ophthalmic solution Commonly known as:  LUMIGAN Place 1 drop into both eyes at bedtime.   calcium-vitamin D 500-200 MG-UNIT tablet Commonly known as:  OSCAL WITH D Take 1 tablet by mouth daily with breakfast.   camphor-menthol lotion Commonly known as:  SARNA Apply 1 application topically daily as needed for itching. Apply to affected area   CENTRUM SILVER tablet Take 1 tablet by mouth daily.   CRANBERRY PLUS VITAMIN C 4200-20-3 MG-MG-UNIT Caps Generic drug:  Cranberry-Vitamin C-Vitamin E Take 2 capsules by mouth daily.   docusate sodium 100 MG capsule Commonly known as:  COLACE Take 100 mg by mouth daily as needed for mild constipation.   dorzolamide-timolol 22.3-6.8 MG/ML ophthalmic solution Commonly known as:  COSOPT Place 1 drop into both eyes 2 (two) times daily.   furosemide 40 MG tablet Commonly known as:  LASIX Take 1 tablet (40 mg total) by mouth daily. What changed:    medication strength  how much to take   magnesium oxide 400 MG tablet Commonly known as:  MAG-OX Take 100 mg by mouth at bedtime.   metoprolol succinate 25 MG 24 hr tablet Commonly known as:  TOPROL-XL Take 1 tablet (25 mg total) by mouth daily.   morphine CONCENTRATE 10 MG/0.5ML Soln concentrated solution Take 0.25 mLs (5 mg total) by mouth every 2 (two) hours as needed for severe pain.   RISA-BID PROBIOTIC Tabs Take 1 tablet by mouth daily.   vitamin B-12 100 MCG tablet Commonly known as:  CYANOCOBALAMIN Take 100 mcg by mouth daily.   Vitamin E 400 units Tabs Take 1 tablet by mouth every evening.   Vitamin-B Complex Tabs Take 1 tablet by mouth daily with breakfast.   ZINC-220 PO Take 1 tablet by mouth every evening.        DISCHARGE INSTRUCTIONS:   Follow-up PMD 6 days  If you experience worsening of your admission symptoms, develop shortness  of breath, life threatening emergency, suicidal or homicidal thoughts you must seek medical attention immediately by calling 911 or calling your MD immediately  if symptoms less severe.  You Must read complete instructions/literature along with all the possible adverse reactions/side effects for all the Medicines you take and that have been prescribed to you. Take any new Medicines after you have completely understood and accept all the possible adverse reactions/side effects.   Please note  You were cared for by a hospitalist during your hospital stay. If you have any questions about your discharge medications or the care you received while you were in the hospital after you are discharged, you can call the unit and asked to speak with the hospitalist on call if the hospitalist that took  care of you is not available. Once you are discharged, your primary care physician will handle any further medical issues. Please note that NO REFILLS for any discharge medications will be authorized once you are discharged, as it is imperative that you return to your primary care physician (or establish a relationship with a primary care physician if you do not have one) for your aftercare needs so that they can reassess your need for medications and monitor your lab values.    Today   CHIEF COMPLAINT:   Chief Complaint  Patient presents with  . Respiratory Distress    HISTORY OF PRESENT ILLNESS:  Kristin Graham  is a 84 y.o. female brought in with respiratory distress.   VITAL SIGNS:  Blood pressure 117/63, pulse 85, temperature 98.4 F (36.9 C), temperature source Oral, resp. rate 18, height 5\' 4"  (1.626 m), weight 69.1 kg (152 lb 4.8 oz), SpO2 95 %.    PHYSICAL EXAMINATION:  GENERAL:  84 y.o.-year-old patient lying in the bed with no acute distress.  EYES: Pupils equal, round, reactive to light and accommodation. No scleral icterus. HEENT: Head atraumatic, normocephalic. Oropharynx and nasopharynx  clear.  NECK:  Supple, no jugular venous distention. No thyroid enlargement, no tenderness.  LUNGS: Decreased breath sounds bilateral bases , no wheezing, rales,rhonchi or crepitation. No use of accessory muscles of respiration.  CARDIOVASCULAR: S1, S2 normal. No murmurs, rubs, or gallops.  ABDOMEN: Soft, non-tender, non-distended. Bowel sounds present. No organomegaly or mass.  EXTREMITIES: 2+ pedal edema, no cyanosis, or clubbing.  NEUROLOGIC:  Sensation intact. Gait not checked.  PSYCHIATRIC: The patient is alert and answers some yes or no questions.  SKIN: No obvious rash, lesion, or ulcer.   DATA REVIEW:   CBC Recent Labs  Lab 04/14/18 0543 04/15/18 0420  WBC 11.4*  --   HGB 12.6 11.9*  HCT 36.9  --   PLT 250  --     Chemistries  Recent Labs  Lab 04/13/18 1820  04/15/18 0420  NA 137   < > 138  K 3.7   < > 3.3*  CL 96*   < > 98  CO2 31   < > 32  GLUCOSE 114*   < > 124*  BUN 10   < > 12  CREATININE 0.31*   < > <0.30*  CALCIUM 8.6*   < > 8.3*  AST 19  --   --   ALT 13  --   --   ALKPHOS 58  --   --   BILITOT 1.7*  --   --    < > = values in this interval not displayed.    Cardiac Enzymes Recent Labs  Lab 04/14/18 0543  TROPONINI 0.22*    Microbiology Results  Results for orders placed or performed during the hospital encounter of 04/09/18  Gastrointestinal Panel by PCR , Stool     Status: None   Collection Time: 04/09/18 11:35 PM  Result Value Ref Range Status   Campylobacter species NOT DETECTED NOT DETECTED Final   Plesimonas shigelloides NOT DETECTED NOT DETECTED Final   Salmonella species NOT DETECTED NOT DETECTED Final   Yersinia enterocolitica NOT DETECTED NOT DETECTED Final   Vibrio species NOT DETECTED NOT DETECTED Final   Vibrio cholerae NOT DETECTED NOT DETECTED Final   Enteroaggregative E coli (EAEC) NOT DETECTED NOT DETECTED Final   Enteropathogenic E coli (EPEC) NOT DETECTED NOT DETECTED Final   Enterotoxigenic E coli (ETEC) NOT DETECTED  NOT DETECTED Final  Shiga like toxin producing E coli (STEC) NOT DETECTED NOT DETECTED Final   Shigella/Enteroinvasive E coli (EIEC) NOT DETECTED NOT DETECTED Final   Cryptosporidium NOT DETECTED NOT DETECTED Final   Cyclospora cayetanensis NOT DETECTED NOT DETECTED Final   Entamoeba histolytica NOT DETECTED NOT DETECTED Final   Giardia lamblia NOT DETECTED NOT DETECTED Final   Adenovirus F40/41 NOT DETECTED NOT DETECTED Final   Astrovirus NOT DETECTED NOT DETECTED Final   Norovirus GI/GII NOT DETECTED NOT DETECTED Final   Rotavirus A NOT DETECTED NOT DETECTED Final   Sapovirus (I, II, IV, and V) NOT DETECTED NOT DETECTED Final    Comment: Performed at [facility], [address] [place], [place] [id number]    RADIOLOGY:  Dg Chest 2 View  Result Date: 04/13/2018 CLINICAL DATA:  Dyspnea, left femur fracture EXAM: CHEST - 2 VIEW COMPARISON:  12/29/2017 FINDINGS: There are small bilateral pleural effusions. Bibasilar airspace disease. Stable borderline cardiomegaly. Aortic atherosclerosis. No pneumothorax. Calcified nodule at the left lung apex. Multiple old left-sided rib fractures. IMPRESSION: 1. Small pleural effusions with bibasilar atelectasis or pneumonia. 2. Stable borderline to mild cardiomegaly. Electronically Signed   By: Jasmine PangKim  Fujinaga M.D.   On: 04/13/2018 17:59   Ct Angio Chest Pe W And/or Wo Contrast  Result Date: 04/13/2018 CLINICAL DATA:  Left femur fracture difficulty breathing EXAM: CT ANGIOGRAPHY CHEST WITH CONTRAST TECHNIQUE: Multidetector CT imaging of the chest was performed using the standard protocol during bolus administration of intravenous contrast. Multiplanar CT image reconstructions and MIPs were obtained to evaluate the vascular anatomy. CONTRAST:  75mL OMNIPAQUE IOHEXOL 350 MG/ML SOLN COMPARISON:  Chest x-ray 04/13/2018, CT abdomen 12/28/2017 PET CT 04/07/2010 FINDINGS: Cardiovascular: Satisfactory opacification of the pulmonary arteries to the segmental  level. Eccentric thrombus within the right inter lobar pulmonary artery and distal main right pulmonary artery. Web like defects within the right inter lobar pulmonary artery consistent with sequela of chronic PE. More acute nonocclusive filling defects within subsegmental right lower lobe pulmonary branch vessels, for example series 5, image number 176 and series 5, image number 160. No definite acute filling defects on the left. Heterogeneous density within the aorta is felt secondary to admixing of contrast. Ectatic ascending aorta without aneurysm. Moderate aortic atherosclerosis. Coronary vascular calcification. Borderline heart size. No significant pericardial effusion. Mediastinum/Nodes: Midline trachea. Heterogeneous thyroid. 13 mm hypodensity inferior to the left bronchus, no change. Esophagus within normal limits. Lungs/Pleura: Small right-sided pleural effusion. Passive atelectasis at the right base. Nodular densities within the apices of both lungs, no change since 2011 and presumably due to scarring. New 10 mm right middle lobe pulmonary nodule, series 6, image number 71. Upper Abdomen: No acute abnormality. Musculoskeletal: Ankylosis of the spine. No acute osseous abnormality. Sternum is intact. Similar appearance of bulky soft tissue and bony changes at the bilateral sternoclavicular joints. Review of the MIP images confirms the above findings. IMPRESSION: 1. Thickening at the distal right pulmonary artery and inter lobar pulmonary artery with several web like defects, suspect for chronic PE. More acute appearing small emboli within subsegmental right lower lobe pulmonary branch vessels. 2. Small right pleural effusion and atelectasis at the right base 3. 10 cm right middle lobe pulmonary nodule. Consider one of the following in 3 months for both low-risk and high-risk individuals: (a) repeat chest CT, (b) follow-up PET-CT, or (c) tissue sampling. This recommendation follows the consensus statement:  Guidelines for Management of Incidental Pulmonary Nodules Detected on CT Images: From the Fleischner Society 2017; Radiology  2017; 098:119-147. Critical Value/emergent results were called by telephone at the time of interpretation on 04/13/2018 at 8:24 pm to Dr. Daryel November , who verbally acknowledged these results. Aortic Atherosclerosis (ICD10-I70.0). Electronically Signed   By: Jasmine Pang M.D.   On: 04/13/2018 20:24   US Venous Img Lower Bilateral  Result Date: 04/14/2018 CLINICAL DATA:  Bilateral lower extremity pain. History of prior DVT and pulmonary embolism. Evaluate for acute or chronic DVT. EXAM: BILATERAL LOWER EXTREMITY VENOUS DOPPLER ULTRASOUND TECHNIQUE: Gray-scale sonography with graded compression, as well as color Doppler and duplex ultrasound were performed to evaluate the lower extremity deep venous systems from the level of the common femoral vein and including the common femoral, femoral, profunda femoral, popliteal and calf veins including the posterior tibial, peroneal and gastrocnemius veins when visible. The superficial great saphenous vein was also interrogated. Spectral Doppler was utilized to evaluate flow at rest and with distal augmentation maneuvers in the common femoral, femoral and popliteal veins. COMPARISON:  None. FINDINGS: RIGHT LOWER EXTREMITY Common Femoral Vein: No evidence of thrombus. Normal compressibility, respiratory phasicity and response to augmentation. Saphenofemoral Junction: No evidence of thrombus. Normal compressibility and flow on color Doppler imaging. Profunda Femoral Vein: No evidence of thrombus. Normal compressibility and flow on color Doppler imaging. Femoral Vein: The mid and distal aspects of the right femoral vein appear diminutive however there is no evidence of thrombus. Normal compressibility, respiratory phasicity and response to augmentation. Popliteal Vein: There is circumferential nonocclusive wall thickening involving the right  popliteal vein (images 20 through 23). Calf Veins: No evidence of thrombus. Normal compressibility and flow on color Doppler imaging. Superficial Great Saphenous Vein: No evidence of thrombus. Normal compressibility. Venous Reflux:  None. Other Findings:  None. LEFT LOWER EXTREMITY Common Femoral Vein: No evidence of thrombus. Normal compressibility, respiratory phasicity and response to augmentation. Saphenofemoral Junction: No evidence of thrombus. Normal compressibility and flow on color Doppler imaging. Profunda Femoral Vein: No evidence of thrombus. Normal compressibility and flow on color Doppler imaging. Femoral Vein: There is circumferential nonocclusive wall thickening involving the distal aspect of the left femoral vein (images 41 and 42). Popliteal Vein: No evidence of thrombus. Normal compressibility, respiratory phasicity and response to augmentation. Calf Veins: No evidence of thrombus. Normal compressibility and flow on color Doppler imaging. Superficial Great Saphenous Vein: No evidence of thrombus. Normal compressibility. Venous Reflux:  None. Other Findings:  None. IMPRESSION: 1. No evidence of acute DVT within the right lower extremity. 2. Chronic nonocclusive circumferential wall thickening involving the right popliteal vein. Additionally, the mid and distal aspect the right femoral vein appear atretic. Constellation of findings are favored to represent the sequela of chronic DVT. _________________________________________________________ 1. No evidence of acute DVT within left lower extremity. 2. Circumferential non occlusive wall thickening/chronic DVT involving the distal aspect of the left femoral vein. Electronically Signed   By: Simonne Come M.D.   On: 04/14/2018 13:58      Management plans discussed with the patient, family and they are in agreement.  CODE STATUS:     Code Status Orders  (From admission, onward)        Start     Ordered   04/13/18 2344  Do not attempt  resuscitation (DNR)  Continuous    Question Answer Comment  In the event of cardiac or respiratory ARREST Do not call a "code blue"   In the event of cardiac or respiratory ARREST Do not perform Intubation, CPR, defibrillation or ACLS   In the  event of cardiac or respiratory ARREST Use medication by any route, position, wound care, and other measures to relive pain and suffering. May use oxygen, suction and manual treatment of airway obstruction as needed for comfort.   Comments rN may pronounce      04/13/18 2343    Code Status History    Date Active Date Inactive Code Status Order ID Comments User Context   04/10/2018 1249 04/12/2018 1715 DNR [id number]  Ramonita Lab, MD Inpatient   04/10/2018 0309 04/10/2018 1249 Full Code [id number]  Arnaldo Natal, MD ED   12/28/2017 0442 12/31/2017 1521 Full Code [id number]  Arnaldo Natal, MD Inpatient   07/08/2017 0352 07/10/2017 2056 Full Code [id number]  Gery Pray, MD ED    Advance Directive Documentation     Most Recent Value  Type of Advance Directive  Healthcare Power of Attorney, Out of facility DNR (pink MOST or yellow form)  Pre-existing out of facility DNR order (yellow form or pink MOST form)  Yellow form placed in chart (order not valid for inpatient use)  "MOST" Form in Place?  -      TOTAL TIME TAKING CARE OF THIS PATIENT: 45 minutes.    Alford Highland M.D on 04/15/2018 at 1:43 PM  Between 7am to 6pm - Pager - [phone]  After 6pm go to [email or web address] - password [email or web address]  [employer] [facility] Office  [phone]  CC: Primary care physician; Lynnea Ferrier, MD

## 2018-04-15 NOTE — Progress Notes (Addendum)
Spoke with dr. Renae Glosswieting to make aware patient had a 15 beat run of svt. Heart rate went to 160 and decreased to 90 per md will place orders as needed. Patient was resting in bed, no distress noted

## 2018-04-15 NOTE — Care Management Note (Addendum)
Case Management Note  Patient Details  Name: Ruthann Cancerlease J Keltz MRN: [id number] Date of Birth: 27-Feb-1916  Subjective/Objective:   Patient to be discharged per MD order. There are orders in place for Home health and palliative care. There were many discussions that took place with the daughter Junious DresserConnie who has not provided accurate information. She seems unsure about what services she has and at this time thinks her mother is a hospice patient, palliative care patient and receives home health. For clarity, RNCM contacted [facility] who is familiar with the patient but [facility] up any services. Hospice liaison tells me a three hour conversation took place between [facility] and family and then they ultimately refused to sign any documents and opposed all hospice or palliative services until the patients [age] birthday. I have reviewed this numnerous times with Junious Dresseronnie and she now tells me her only interest is with home health services via [facility] as she has utilized them before. Referral placed with Jermaine who accepts this case. Patient has hospital bed, hoyer lift, etc all available in the home and family express no needs for more DME. Daughter repeatedly asks staff to assist in helping her to fix the hospital bed her mother is on at home, according to her the bed functions perfectly but she would like a different side rail. As this bed was bought from a medical supply store I suggested she contact them for parts, she finally agreed to call tomorrow. Please ultimately disregard previous RNCM notes that stated patient was open with hospice or palliative services since, as noted, the facility states family refused these services although the daughter consistently agrees that she refused but that her mother is "a hospice patient". [facility], medical necessity complete. Buddy DutyJosh Shawnika Pepin RN BSN RNCM [phone]  [phone]             Also, provided an eliquis voucher as this is a new medication for the patient   Action/Plan:   Expected Discharge Date:  04/15/18               Expected Discharge Plan:     In-House Referral:     Discharge planning Services  CM Consult  Post Acute Care Choice:  Home Health, Resumption of Svcs/PTA Provider Choice offered to:  Patient, Adult Children  DME Arranged:    DME Agency:     HH Arranged:  RN, PT, Nurse's Aide HH Agency:  [facility]  Status of Service:  Completed, signed off  If discussed at Long Length of Stay Meetings, dates discussed:    Additional Comments:  Virgel ManifoldJosh A Audelia Knape, RN 04/15/2018, 2:25 PM

## 2019-08-19 IMAGING — CT CT FEMUR *L* W/O CM
3 series · 14 of 34 positions shown, 17 images · non-contrast
Comparison: Radiograph of the left knee dated 04/09/2018

CLINICAL DATA: [AGE] female with left femur fracture.

EXAM:
CT OF THE LOWER LEFT EXTREMITY WITHOUT CONTRAST
TECHNIQUE: Multidetector CT imaging of the lower left extremity was performed
according to the standard protocol.

[Series 7: axial (person_name) (person_name) · axial · 0.46mm/px · z∈[-1425,-1206]mm · 6 of 191 slices shown, 8 images]
[im 30/191  soft-tissue]
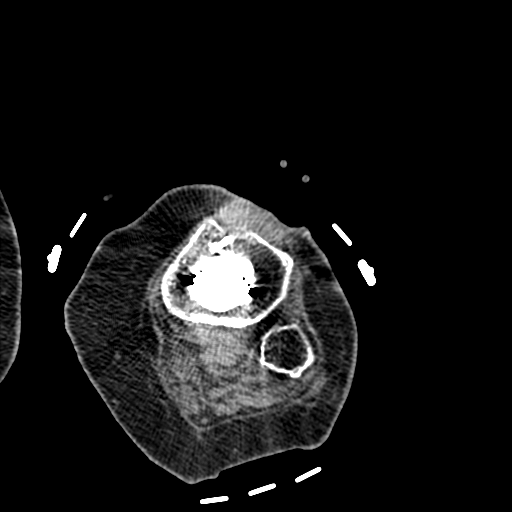
[im 30/191  bone]
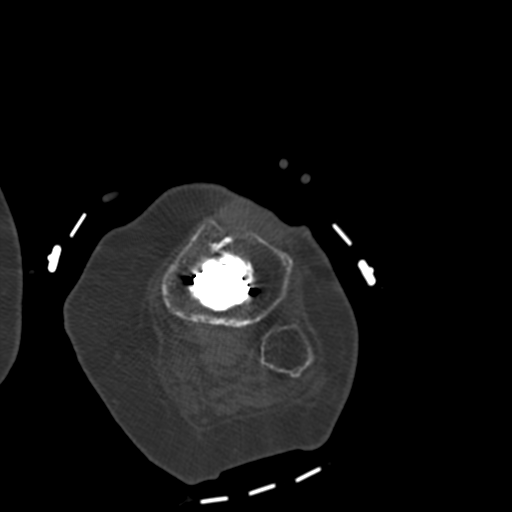
[im 59/191  bone]
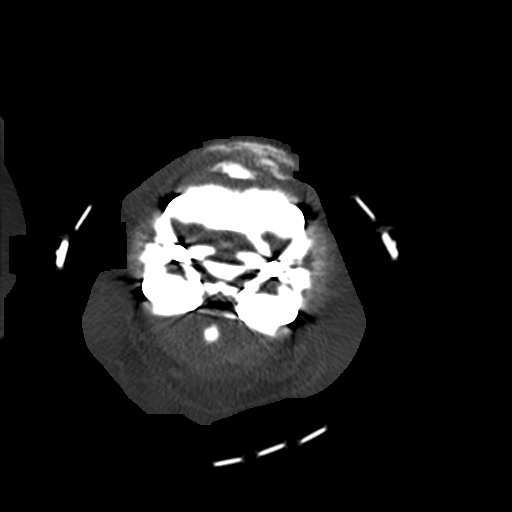
[im 88/191  bone]
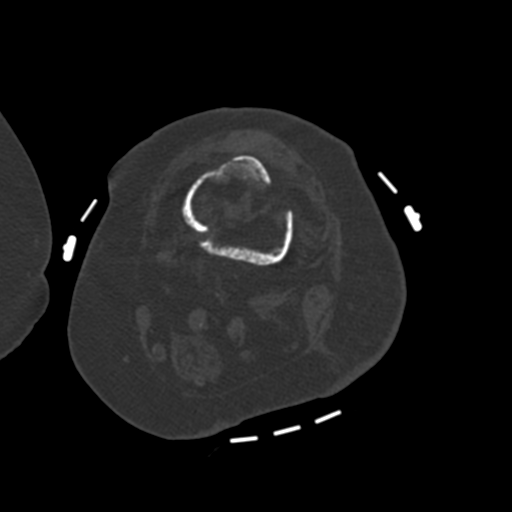
[im 117/191  bone]
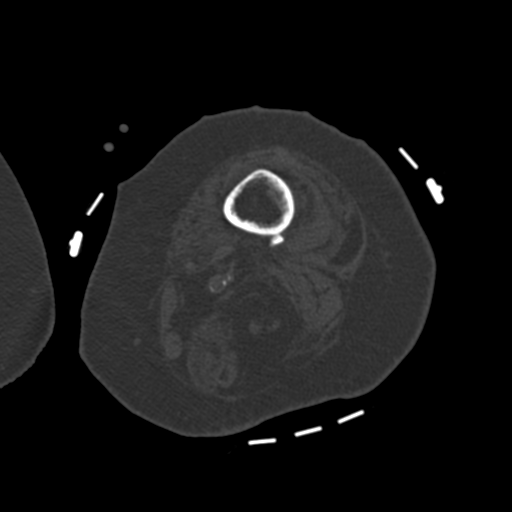
[im 147/191  soft-tissue]
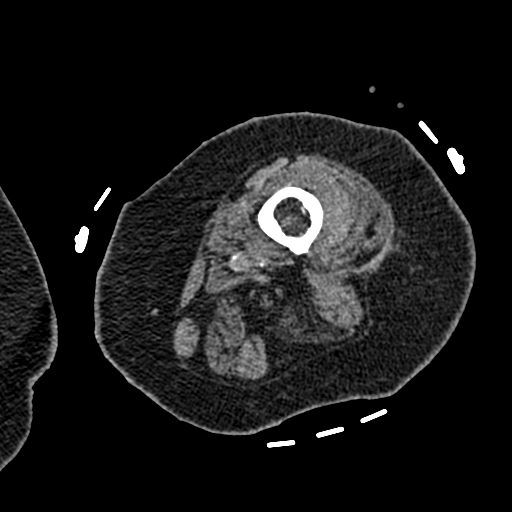
[im 147/191  bone]
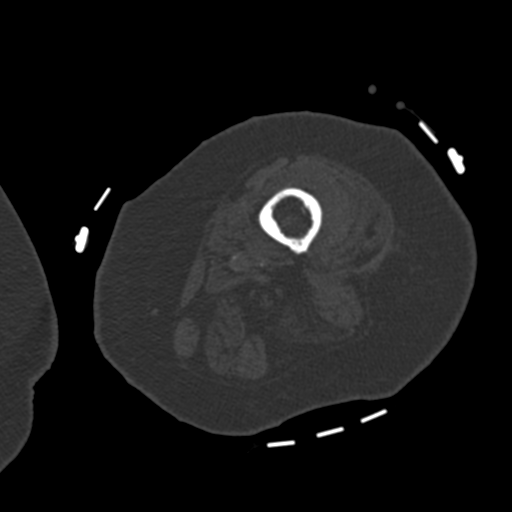
[im 176/191  bone]
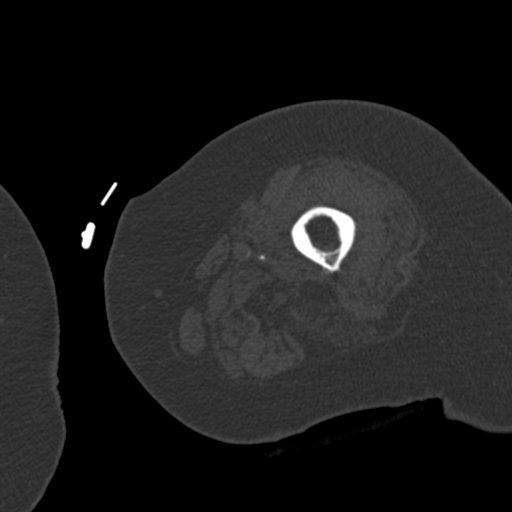

[Series 9: coronal st · coronal · 0.43mm/px · 3 of 146 slices shown]
[im 30/146  bone]
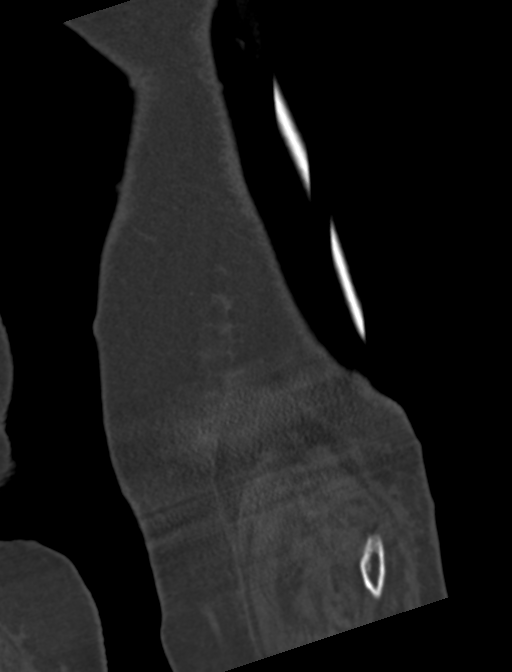
[im 59/146  bone]
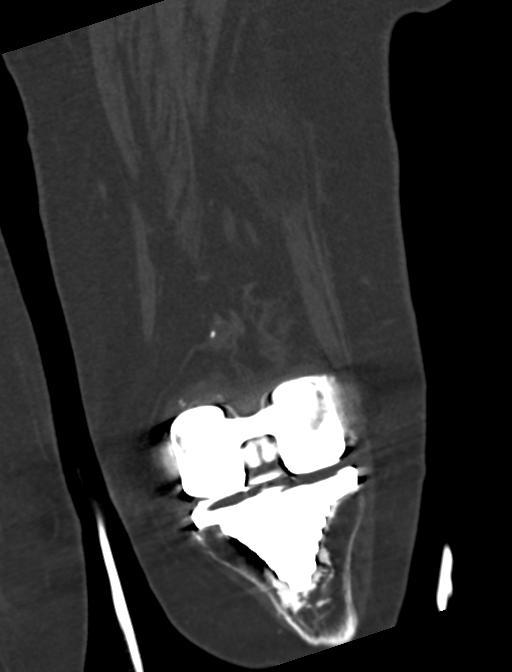
[im 88/146  bone]
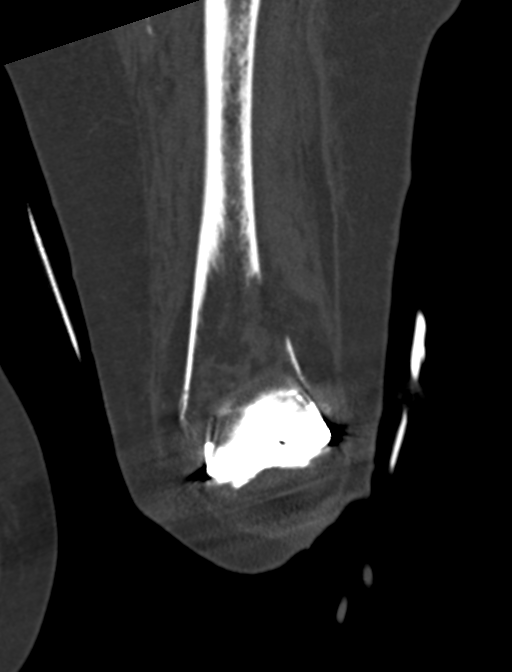

[Series 10: sagittal st · sagittal · 0.43mm/px · 5 of 148 slices shown, 6 images]
[im 53/148  bone]
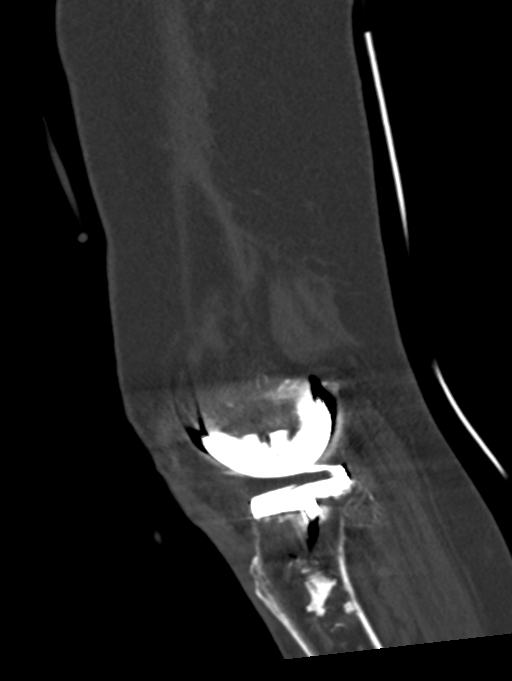
[im 63/148  bone]
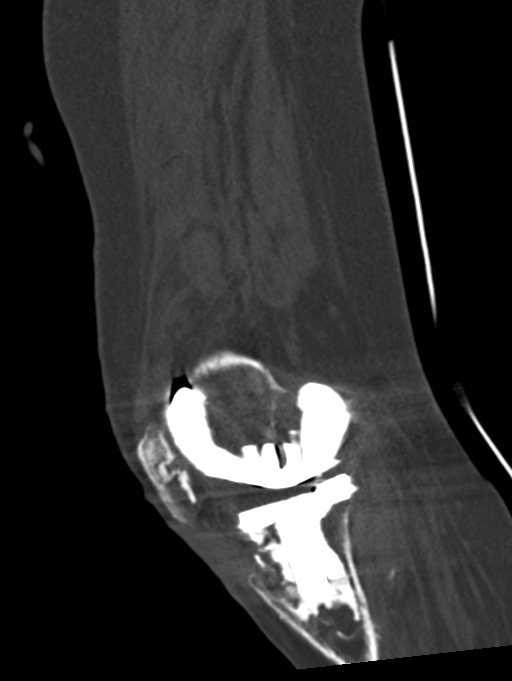
[im 74/148  soft-tissue]
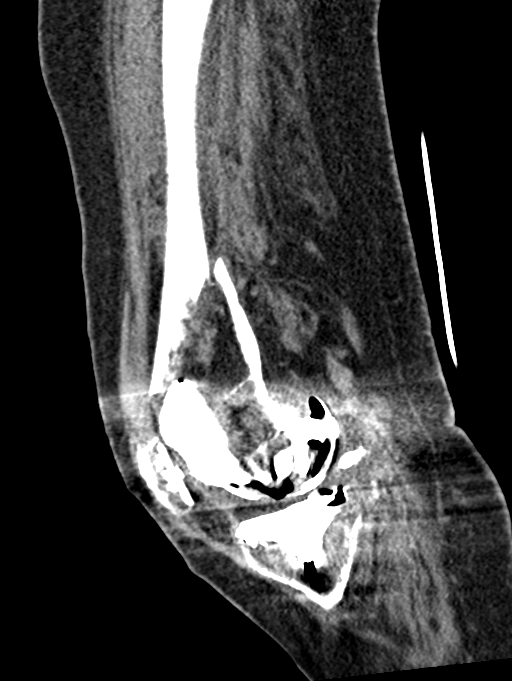
[im 74/148  bone]
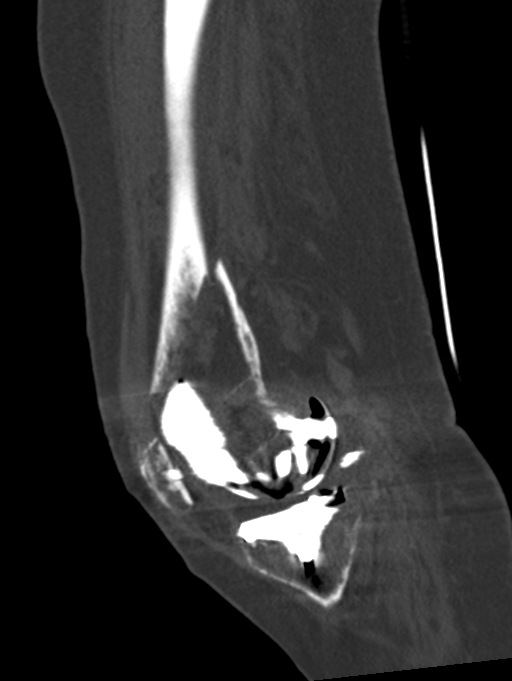
[im 85/148  bone]
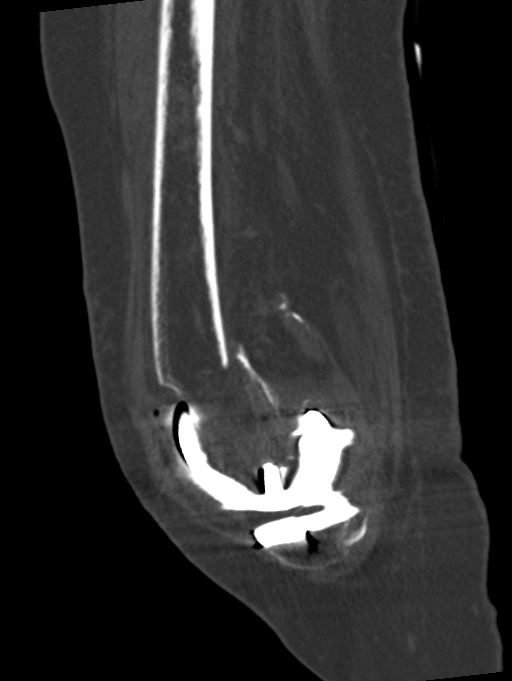
[im 95/148  bone]
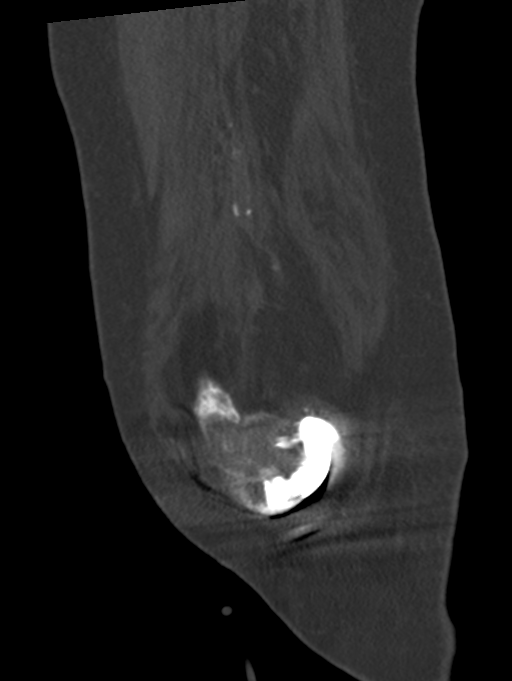

[14 of 34 positions shown; findings below may reference images not displayed]

FINDINGS: Bones/Joint/Cartilage

There is a displaced oblique fracture of the distal femur with
approximately 1 cm distraction gap and mild dorsal angulation and
displacement of the distal fracture fragment. The fracture extends
to the superior aspect of the femoral component of the arthroplasty.
Evaluation of the distal femur and arthroplasty is limited due to
advanced osteopenia and associated streak artifact from the metallic
orthopedic hardware. There is no dislocation. No large joint
effusion.

Ligaments

Suboptimally assessed by CT.

Muscles and Tendons

Small amount of hematoma noted in the distal quadriceps.

Soft tissues

No acute findings.  Vascular calcification.
IMPRESSION: Mildly displaced and posteriorly angulated fracture of the distal
femur with extension to the femoral component of the arthroplasty.
There is advanced osteopenia and streak artifact caused by metallic
hardware which limit evaluation of the fracture and orthopedic
hardware.

## 2019-10-01 ENCOUNTER — Ambulatory Visit: Payer: Medicare Other | Admitting: Physician Assistant

## 2019-10-09 ENCOUNTER — Ambulatory Visit: Payer: Medicare Other | Admitting: Internal Medicine

## 2019-11-11 DEATH — deceased
